# Patient Record
Sex: Male | Born: 1965 | Race: White | Hispanic: No | Marital: Single | Smoking: Former smoker
Health system: Southern US, Community
[De-identification: ages and names within clinical notes are randomized; demographics above are authoritative.]

## PROBLEM LIST (undated history)

## (undated) DIAGNOSIS — C8190 Hodgkin lymphoma, unspecified, unspecified site: Secondary | ICD-10-CM

## (undated) DIAGNOSIS — I2510 Atherosclerotic heart disease of native coronary artery without angina pectoris: Secondary | ICD-10-CM

## (undated) DIAGNOSIS — I4510 Unspecified right bundle-branch block: Secondary | ICD-10-CM

## (undated) DIAGNOSIS — Z720 Tobacco use: Secondary | ICD-10-CM

## (undated) DIAGNOSIS — J449 Chronic obstructive pulmonary disease, unspecified: Secondary | ICD-10-CM

## (undated) HISTORY — DX: Atherosclerotic heart disease of native coronary artery without angina pectoris: I25.10

## (undated) HISTORY — PX: LYMPH NODE BIOPSY: SHX201

---

## 2016-10-26 DIAGNOSIS — H9190 Unspecified hearing loss, unspecified ear: Secondary | ICD-10-CM | POA: Diagnosis not present

## 2016-10-26 DIAGNOSIS — H269 Unspecified cataract: Secondary | ICD-10-CM | POA: Diagnosis not present

## 2016-10-26 DIAGNOSIS — Z23 Encounter for immunization: Secondary | ICD-10-CM | POA: Diagnosis not present

## 2016-10-26 DIAGNOSIS — Z0000 Encounter for general adult medical examination without abnormal findings: Secondary | ICD-10-CM | POA: Diagnosis not present

## 2016-10-26 DIAGNOSIS — M549 Dorsalgia, unspecified: Secondary | ICD-10-CM | POA: Diagnosis not present

## 2016-10-26 DIAGNOSIS — Z8571 Personal history of Hodgkin lymphoma: Secondary | ICD-10-CM | POA: Diagnosis not present

## 2016-10-26 DIAGNOSIS — Z789 Other specified health status: Secondary | ICD-10-CM | POA: Diagnosis not present

## 2016-10-26 DIAGNOSIS — F17200 Nicotine dependence, unspecified, uncomplicated: Secondary | ICD-10-CM | POA: Diagnosis not present

## 2016-10-26 DIAGNOSIS — Z125 Encounter for screening for malignant neoplasm of prostate: Secondary | ICD-10-CM | POA: Diagnosis not present

## 2016-11-07 DIAGNOSIS — H918X2 Other specified hearing loss, left ear: Secondary | ICD-10-CM | POA: Diagnosis not present

## 2016-11-07 DIAGNOSIS — H903 Sensorineural hearing loss, bilateral: Secondary | ICD-10-CM | POA: Diagnosis not present

## 2016-11-09 DIAGNOSIS — F17210 Nicotine dependence, cigarettes, uncomplicated: Secondary | ICD-10-CM | POA: Diagnosis not present

## 2016-11-09 DIAGNOSIS — Z8571 Personal history of Hodgkin lymphoma: Secondary | ICD-10-CM | POA: Diagnosis not present

## 2016-11-09 DIAGNOSIS — M549 Dorsalgia, unspecified: Secondary | ICD-10-CM | POA: Diagnosis not present

## 2016-11-09 DIAGNOSIS — Z716 Tobacco abuse counseling: Secondary | ICD-10-CM | POA: Diagnosis not present

## 2017-01-05 DIAGNOSIS — H2511 Age-related nuclear cataract, right eye: Secondary | ICD-10-CM | POA: Diagnosis not present

## 2017-01-05 DIAGNOSIS — H2589 Other age-related cataract: Secondary | ICD-10-CM | POA: Diagnosis not present

## 2017-03-13 DIAGNOSIS — H2589 Other age-related cataract: Secondary | ICD-10-CM | POA: Diagnosis not present

## 2017-03-19 DIAGNOSIS — H25812 Combined forms of age-related cataract, left eye: Secondary | ICD-10-CM | POA: Diagnosis not present

## 2017-03-19 DIAGNOSIS — H2589 Other age-related cataract: Secondary | ICD-10-CM | POA: Diagnosis not present

## 2017-05-01 DIAGNOSIS — H2511 Age-related nuclear cataract, right eye: Secondary | ICD-10-CM | POA: Diagnosis not present

## 2017-05-01 DIAGNOSIS — H25811 Combined forms of age-related cataract, right eye: Secondary | ICD-10-CM | POA: Diagnosis not present

## 2017-05-01 DIAGNOSIS — Z79899 Other long term (current) drug therapy: Secondary | ICD-10-CM | POA: Diagnosis not present

## 2017-05-01 DIAGNOSIS — G43909 Migraine, unspecified, not intractable, without status migrainosus: Secondary | ICD-10-CM | POA: Diagnosis not present

## 2017-05-01 DIAGNOSIS — J449 Chronic obstructive pulmonary disease, unspecified: Secondary | ICD-10-CM | POA: Diagnosis not present

## 2017-05-01 DIAGNOSIS — F17210 Nicotine dependence, cigarettes, uncomplicated: Secondary | ICD-10-CM | POA: Diagnosis not present

## 2017-08-06 DIAGNOSIS — Z8571 Personal history of Hodgkin lymphoma: Secondary | ICD-10-CM | POA: Diagnosis not present

## 2017-08-06 DIAGNOSIS — R0602 Shortness of breath: Secondary | ICD-10-CM | POA: Diagnosis not present

## 2017-08-06 DIAGNOSIS — R05 Cough: Secondary | ICD-10-CM | POA: Diagnosis not present

## 2017-08-06 DIAGNOSIS — F17210 Nicotine dependence, cigarettes, uncomplicated: Secondary | ICD-10-CM | POA: Diagnosis not present

## 2017-08-06 DIAGNOSIS — J209 Acute bronchitis, unspecified: Secondary | ICD-10-CM | POA: Diagnosis not present

## 2017-12-09 DIAGNOSIS — J029 Acute pharyngitis, unspecified: Secondary | ICD-10-CM | POA: Diagnosis not present

## 2017-12-11 DIAGNOSIS — Z681 Body mass index (BMI) 19 or less, adult: Secondary | ICD-10-CM | POA: Diagnosis not present

## 2017-12-11 DIAGNOSIS — J029 Acute pharyngitis, unspecified: Secondary | ICD-10-CM | POA: Diagnosis not present

## 2018-05-21 DIAGNOSIS — F17210 Nicotine dependence, cigarettes, uncomplicated: Secondary | ICD-10-CM | POA: Diagnosis not present

## 2018-05-21 DIAGNOSIS — J449 Chronic obstructive pulmonary disease, unspecified: Secondary | ICD-10-CM | POA: Diagnosis not present

## 2018-05-21 DIAGNOSIS — R42 Dizziness and giddiness: Secondary | ICD-10-CM | POA: Diagnosis not present

## 2018-06-04 DIAGNOSIS — Z1331 Encounter for screening for depression: Secondary | ICD-10-CM | POA: Diagnosis not present

## 2018-06-04 DIAGNOSIS — R55 Syncope and collapse: Secondary | ICD-10-CM | POA: Diagnosis not present

## 2018-06-04 DIAGNOSIS — F17210 Nicotine dependence, cigarettes, uncomplicated: Secondary | ICD-10-CM | POA: Diagnosis not present

## 2018-06-04 DIAGNOSIS — F17200 Nicotine dependence, unspecified, uncomplicated: Secondary | ICD-10-CM | POA: Diagnosis not present

## 2018-06-04 DIAGNOSIS — R636 Underweight: Secondary | ICD-10-CM | POA: Diagnosis not present

## 2018-06-04 DIAGNOSIS — Z681 Body mass index (BMI) 19 or less, adult: Secondary | ICD-10-CM | POA: Diagnosis not present

## 2018-06-04 DIAGNOSIS — F329 Major depressive disorder, single episode, unspecified: Secondary | ICD-10-CM | POA: Diagnosis not present

## 2018-06-07 DIAGNOSIS — S299XXA Unspecified injury of thorax, initial encounter: Secondary | ICD-10-CM | POA: Diagnosis not present

## 2018-06-07 DIAGNOSIS — S161XXA Strain of muscle, fascia and tendon at neck level, initial encounter: Secondary | ICD-10-CM | POA: Diagnosis not present

## 2018-06-07 DIAGNOSIS — M546 Pain in thoracic spine: Secondary | ICD-10-CM | POA: Diagnosis not present

## 2018-06-07 DIAGNOSIS — M542 Cervicalgia: Secondary | ICD-10-CM | POA: Diagnosis not present

## 2019-09-10 DIAGNOSIS — F17200 Nicotine dependence, unspecified, uncomplicated: Secondary | ICD-10-CM | POA: Diagnosis not present

## 2019-09-10 DIAGNOSIS — I4510 Unspecified right bundle-branch block: Secondary | ICD-10-CM | POA: Diagnosis not present

## 2019-09-10 DIAGNOSIS — R079 Chest pain, unspecified: Secondary | ICD-10-CM | POA: Diagnosis not present

## 2019-09-10 DIAGNOSIS — J449 Chronic obstructive pulmonary disease, unspecified: Secondary | ICD-10-CM | POA: Diagnosis not present

## 2019-09-10 DIAGNOSIS — R072 Precordial pain: Secondary | ICD-10-CM | POA: Diagnosis not present

## 2019-09-10 DIAGNOSIS — Z720 Tobacco use: Secondary | ICD-10-CM | POA: Diagnosis not present

## 2019-09-11 ENCOUNTER — Inpatient Hospital Stay (HOSPITAL_COMMUNITY)
Admission: AD | Admit: 2019-09-11 | Discharge: 2019-09-21 | DRG: 234 | Disposition: A | Discharge status: Home / Self Care | Payer: Medicare HMO | Source: Other Acute Inpatient Hospital | Attending: Cardiothoracic Surgery | Admitting: Cardiothoracic Surgery

## 2019-09-11 ENCOUNTER — Encounter (HOSPITAL_COMMUNITY): Payer: Self-pay | Admitting: Physician Assistant

## 2019-09-11 DIAGNOSIS — Z23 Encounter for immunization: Secondary | ICD-10-CM

## 2019-09-11 DIAGNOSIS — R911 Solitary pulmonary nodule: Secondary | ICD-10-CM | POA: Diagnosis present

## 2019-09-11 DIAGNOSIS — Z720 Tobacco use: Secondary | ICD-10-CM | POA: Diagnosis not present

## 2019-09-11 DIAGNOSIS — I088 Other rheumatic multiple valve diseases: Secondary | ICD-10-CM | POA: Diagnosis not present

## 2019-09-11 DIAGNOSIS — I259 Chronic ischemic heart disease, unspecified: Secondary | ICD-10-CM | POA: Diagnosis not present

## 2019-09-11 DIAGNOSIS — Z9221 Personal history of antineoplastic chemotherapy: Secondary | ICD-10-CM

## 2019-09-11 DIAGNOSIS — R9439 Abnormal result of other cardiovascular function study: Secondary | ICD-10-CM | POA: Diagnosis not present

## 2019-09-11 DIAGNOSIS — E782 Mixed hyperlipidemia: Secondary | ICD-10-CM | POA: Diagnosis present

## 2019-09-11 DIAGNOSIS — I2510 Atherosclerotic heart disease of native coronary artery without angina pectoris: Secondary | ICD-10-CM

## 2019-09-11 DIAGNOSIS — F17210 Nicotine dependence, cigarettes, uncomplicated: Secondary | ICD-10-CM | POA: Diagnosis not present

## 2019-09-11 DIAGNOSIS — D62 Acute posthemorrhagic anemia: Secondary | ICD-10-CM | POA: Diagnosis not present

## 2019-09-11 DIAGNOSIS — M549 Dorsalgia, unspecified: Secondary | ICD-10-CM | POA: Diagnosis present

## 2019-09-11 DIAGNOSIS — F1290 Cannabis use, unspecified, uncomplicated: Secondary | ICD-10-CM | POA: Diagnosis not present

## 2019-09-11 DIAGNOSIS — Z9889 Other specified postprocedural states: Secondary | ICD-10-CM

## 2019-09-11 DIAGNOSIS — Z01810 Encounter for preprocedural cardiovascular examination: Secondary | ICD-10-CM | POA: Diagnosis not present

## 2019-09-11 DIAGNOSIS — J439 Emphysema, unspecified: Secondary | ICD-10-CM | POA: Diagnosis not present

## 2019-09-11 DIAGNOSIS — Z20828 Contact with and (suspected) exposure to other viral communicable diseases: Secondary | ICD-10-CM | POA: Diagnosis present

## 2019-09-11 DIAGNOSIS — Z8572 Personal history of non-Hodgkin lymphomas: Secondary | ICD-10-CM

## 2019-09-11 DIAGNOSIS — D72829 Elevated white blood cell count, unspecified: Secondary | ICD-10-CM | POA: Diagnosis not present

## 2019-09-11 DIAGNOSIS — G8929 Other chronic pain: Secondary | ICD-10-CM | POA: Diagnosis present

## 2019-09-11 DIAGNOSIS — J441 Chronic obstructive pulmonary disease with (acute) exacerbation: Secondary | ICD-10-CM | POA: Diagnosis not present

## 2019-09-11 DIAGNOSIS — R001 Bradycardia, unspecified: Secondary | ICD-10-CM | POA: Diagnosis not present

## 2019-09-11 DIAGNOSIS — Z885 Allergy status to narcotic agent status: Secondary | ICD-10-CM

## 2019-09-11 DIAGNOSIS — T50995A Adverse effect of other drugs, medicaments and biological substances, initial encounter: Secondary | ICD-10-CM | POA: Diagnosis not present

## 2019-09-11 DIAGNOSIS — J449 Chronic obstructive pulmonary disease, unspecified: Secondary | ICD-10-CM

## 2019-09-11 DIAGNOSIS — D696 Thrombocytopenia, unspecified: Secondary | ICD-10-CM | POA: Diagnosis not present

## 2019-09-11 DIAGNOSIS — I25110 Atherosclerotic heart disease of native coronary artery with unstable angina pectoris: Secondary | ICD-10-CM | POA: Diagnosis present

## 2019-09-11 DIAGNOSIS — Z716 Tobacco abuse counseling: Secondary | ICD-10-CM | POA: Diagnosis not present

## 2019-09-11 DIAGNOSIS — I25118 Atherosclerotic heart disease of native coronary artery with other forms of angina pectoris: Secondary | ICD-10-CM | POA: Diagnosis not present

## 2019-09-11 DIAGNOSIS — Z4682 Encounter for fitting and adjustment of non-vascular catheter: Secondary | ICD-10-CM | POA: Diagnosis not present

## 2019-09-11 DIAGNOSIS — Z951 Presence of aortocoronary bypass graft: Secondary | ICD-10-CM

## 2019-09-11 DIAGNOSIS — Z8249 Family history of ischemic heart disease and other diseases of the circulatory system: Secondary | ICD-10-CM | POA: Diagnosis not present

## 2019-09-11 DIAGNOSIS — E43 Unspecified severe protein-calorie malnutrition: Secondary | ICD-10-CM | POA: Insufficient documentation

## 2019-09-11 DIAGNOSIS — I444 Left anterior fascicular block: Secondary | ICD-10-CM | POA: Diagnosis not present

## 2019-09-11 DIAGNOSIS — I214 Non-ST elevation (NSTEMI) myocardial infarction: Secondary | ICD-10-CM | POA: Diagnosis not present

## 2019-09-11 DIAGNOSIS — I252 Old myocardial infarction: Secondary | ICD-10-CM | POA: Diagnosis not present

## 2019-09-11 DIAGNOSIS — I4510 Unspecified right bundle-branch block: Secondary | ICD-10-CM | POA: Diagnosis present

## 2019-09-11 DIAGNOSIS — I10 Essential (primary) hypertension: Secondary | ICD-10-CM | POA: Diagnosis not present

## 2019-09-11 DIAGNOSIS — J939 Pneumothorax, unspecified: Secondary | ICD-10-CM | POA: Diagnosis not present

## 2019-09-11 DIAGNOSIS — J9811 Atelectasis: Secondary | ICD-10-CM | POA: Diagnosis not present

## 2019-09-11 DIAGNOSIS — J90 Pleural effusion, not elsewhere classified: Secondary | ICD-10-CM | POA: Diagnosis not present

## 2019-09-11 DIAGNOSIS — R072 Precordial pain: Secondary | ICD-10-CM | POA: Diagnosis not present

## 2019-09-11 DIAGNOSIS — I471 Supraventricular tachycardia: Secondary | ICD-10-CM | POA: Diagnosis not present

## 2019-09-11 DIAGNOSIS — F17200 Nicotine dependence, unspecified, uncomplicated: Secondary | ICD-10-CM | POA: Diagnosis not present

## 2019-09-11 DIAGNOSIS — I25119 Atherosclerotic heart disease of native coronary artery with unspecified angina pectoris: Secondary | ICD-10-CM | POA: Diagnosis not present

## 2019-09-11 DIAGNOSIS — Z09 Encounter for follow-up examination after completed treatment for conditions other than malignant neoplasm: Secondary | ICD-10-CM

## 2019-09-11 DIAGNOSIS — R079 Chest pain, unspecified: Secondary | ICD-10-CM

## 2019-09-11 HISTORY — DX: Chronic obstructive pulmonary disease, unspecified: J44.9

## 2019-09-11 HISTORY — DX: Hodgkin lymphoma, unspecified, unspecified site: C81.90

## 2019-09-11 HISTORY — DX: Tobacco use: Z72.0

## 2019-09-11 HISTORY — DX: Unspecified right bundle-branch block: I45.10

## 2019-09-11 HISTORY — DX: Chest pain, unspecified: R07.9

## 2019-09-11 LAB — TROPONIN I (HIGH SENSITIVITY): Troponin I (High Sensitivity): 5 ng/L (ref <18)

## 2019-09-11 MED ORDER — SODIUM CHLORIDE 0.9 % IV SOLN: 250.0000 mL | INTRAVENOUS | Status: DC | PRN

## 2019-09-11 MED ORDER — SODIUM CHLORIDE 0.9 % WEIGHT BASED INFUSION
1.0000 mL/kg/h | INTRAVENOUS | Status: DC
  Administered 2019-09-12: 05:00:00 1 mL/kg/h via INTRAVENOUS

## 2019-09-11 MED ORDER — ASPIRIN 81 MG PO CHEW
81.0000 mg | CHEWABLE_TABLET | ORAL | Status: AC
  Administered 2019-09-12: 07:00:00 81 mg via ORAL
  Filled 2019-09-11: qty 1

## 2019-09-11 MED ORDER — ACETAMINOPHEN 325 MG PO TABS
650.0000 mg | ORAL_TABLET | ORAL | Status: DC | PRN
  Administered 2019-09-13: 650 mg via ORAL
  Filled 2019-09-11: qty 2

## 2019-09-11 MED ORDER — HEPARIN (PORCINE) 25000 UT/250ML-% IV SOLN
900.0000 [IU]/h | INTRAVENOUS | Status: DC
  Administered 2019-09-11: 750 [IU]/h via INTRAVENOUS
  Filled 2019-09-11: qty 250

## 2019-09-11 MED ORDER — HEPARIN BOLUS VIA INFUSION
3500.0000 [IU] | Freq: Once | INTRAVENOUS | Status: AC
  Administered 2019-09-11: 3500 [IU] via INTRAVENOUS
  Filled 2019-09-11: qty 3500

## 2019-09-11 MED ORDER — SODIUM CHLORIDE 0.9% FLUSH: 3.0000 mL | INTRAVENOUS | Status: DC | PRN

## 2019-09-11 MED ORDER — SODIUM CHLORIDE 0.9 % WEIGHT BASED INFUSION
3.0000 mL/kg/h | INTRAVENOUS | Status: DC
  Administered 2019-09-12: 05:00:00 3 mL/kg/h via INTRAVENOUS

## 2019-09-11 MED ORDER — ONDANSETRON HCL 4 MG/2ML IJ SOLN: 4.0000 mg | Freq: Four times a day (QID) | INTRAMUSCULAR | Status: DC | PRN

## 2019-09-11 MED ORDER — SODIUM CHLORIDE 0.9% FLUSH
3.0000 mL | Freq: Two times a day (BID) | INTRAVENOUS | Status: DC
  Administered 2019-09-11 – 2019-09-14 (×3): 3 mL via INTRAVENOUS

## 2019-09-11 NOTE — H&P (Signed)
History and Physical   Russell Harris [id number] DOB: 10-11-1966 DOA: 09/11/2019  Referring MD/NP/PA: From [facility]  PCP: Street, Sharon Mt, MD   Outpatient Specialists: None  Patient coming from: [facility]  Chief Complaint: Chest pain  HPI: Russell Harris is a 53 y.o. male with medical history significant of hypertension, Hodgkin's lymphoma, COPD, tobacco abuse, right bundle branch block and extensive family history of coronary artery disease who came to the ER at [facility] with substernal chest pain yesterday that has been on and up and persistent.  Rated as 6 out of 10.  He is chest pain-free now after nitroglycerin.  Patient smokes about a pack per day.  2 of his siblings had coronary artery disease 1 of whom passed away early.  He did have troponin negative x3.  Nuclear medicine Myoview was done today which shows normal EF but abnormal scan with large anterior wall ischemia.  Patient was transferred here for further cardiology evaluation.  He denied any chest pain or shortness of breath now no fever no cough no nausea vomiting or diarrhea..    Review of Systems: As per HPI otherwise 10 point review of systems negative.    Past Medical History:  Diagnosis Date  . COPD (chronic obstructive pulmonary disease) ([place])   . Hodgkin lymphoma ([place])    1997, s/p chemo  . RBBB   . Tobacco abuse     Past Surgical History:  Procedure Laterality Date  . LYMPH NODE BIOPSY     of neck, diagnosed with hodgkin's lymphoma in 1997, s/p 6 month of chemo     reports that he has been smoking cigarettes. He has never used smokeless tobacco. He reports current alcohol use. He reports current drug use. Drug: Marijuana.  Not on File  Family History  Problem Relation Age of Onset  . Aneurysm Father        of brain  . Heart attack Sister        before age 68  . Heart attack Brother        2 brothers had MI after age 53s     Prior to Admission medications   Not on File     Physical Exam: Vitals:   09/11/19 1900 09/11/19 2000 09/11/19 2115  BP: 114/78  119/71  Pulse: 61  60  Resp: 16  20  Temp: 98.7 F (37.1 C)  98.4 F (36.9 C)  TempSrc: Oral  Oral  SpO2: 98%  99%  Weight:  61 kg       Constitutional: NAD, calm, comfortable Vitals:   09/11/19 1900 09/11/19 2000 09/11/19 2115  BP: 114/78  119/71  Pulse: 61  60  Resp: 16  20  Temp: 98.7 F (37.1 C)  98.4 F (36.9 C)  TempSrc: Oral  Oral  SpO2: 98%  99%  Weight:  61 kg    Eyes: PERRL, lids and conjunctivae normal ENMT: Mucous membranes are moist. Posterior pharynx clear of any exudate or lesions.Normal dentition.  Neck: normal, supple, no masses, no thyromegaly Respiratory: clear to auscultation bilaterally, no wheezing, no crackles. Normal respiratory effort. No accessory muscle use.  Cardiovascular: Regular rate and rhythm, no murmurs / rubs / gallops. No extremity edema. 2+ pedal pulses. No carotid bruits.  Abdomen: no tenderness, no masses palpated. No hepatosplenomegaly. Bowel sounds positive.  Musculoskeletal: no clubbing / cyanosis. No joint deformity upper and lower extremities. Good ROM, no contractures. Normal muscle tone.  Skin: no rashes, lesions, ulcers. No induration Neurologic:  CN 2-12 grossly intact. Sensation intact, DTR normal. Strength 5/5 in all 4.  Psychiatric: Normal judgment and insight. Alert and oriented x 3. Normal mood.     Labs on Admission: I have personally reviewed following labs and imaging studies  CBC: No results for input(s): WBC, NEUTROABS, HGB, HCT, MCV, PLT in the last 168 hours. Basic Metabolic Panel: No results for input(s): NA, K, CL, CO2, GLUCOSE, BUN, CREATININE, CALCIUM, MG, PHOS in the last 168 hours. GFR: CrCl cannot be calculated (No successful lab value found.). Liver Function Tests: No results for input(s): AST, ALT, ALKPHOS, BILITOT, PROT, ALBUMIN in the last 168 hours. No results for input(s): LIPASE, AMYLASE in the last 168 hours. No  results for input(s): AMMONIA in the last 168 hours. Coagulation Profile: No results for input(s): INR, PROTIME in the last 168 hours. Cardiac Enzymes: No results for input(s): CKTOTAL, CKMB, CKMBINDEX, TROPONINI in the last 168 hours. BNP (last 3 results) No results for input(s): PROBNP in the last 8760 hours. HbA1C: No results for input(s): HGBA1C in the last 72 hours. CBG: No results for input(s): GLUCAP in the last 168 hours. Lipid Profile: No results for input(s): CHOL, HDL, LDLCALC, TRIG, CHOLHDL, LDLDIRECT in the last 72 hours. Thyroid Function Tests: No results for input(s): TSH, T4TOTAL, FREET4, T3FREE, THYROIDAB in the last 72 hours. Anemia Panel: No results for input(s): VITAMINB12, FOLATE, FERRITIN, TIBC, IRON, RETICCTPCT in the last 72 hours. Urine analysis: No results found for: COLORURINE, APPEARANCEUR, LABSPEC, PHURINE, GLUCOSEU, HGBUR, BILIRUBINUR, KETONESUR, PROTEINUR, UROBILINOGEN, NITRITE, LEUKOCYTESUR Sepsis Labs: @LABRCNTIP (procalcitonin:4,lacticidven:4) )No results found for this or any previous visit (from the past 240 hour(s)).   Radiological Exams on Admission: No results found.  EKG: Independently reviewed.  Pending current  Assessment/Plan Principal Problem:   Chest pain Active Problems:   Abnormal stress test   Tobacco abuse   Benign essential HTN   COPD (chronic obstructive pulmonary disease) ([place])     #1 chest pain: Now resolved.  Abnormal cardiac stress testing.  Initiate IV heparin.  PRN nitroglycerin.  Cardiology consulted.  Will follow recommendations.  #2 tobacco abuse: Counseling provided.  Initiate nicotine patch.  #3 elevated blood pressure: No previous diagnosis of hypertension.  Monitor blood pressure closely and treat as necessary  #4 COPD: No exacerbation.  Continue empiric treatment   DVT prophylaxis: Heparin drip Code Status: Full code Family Communication: No family at bedside Disposition Plan: To be determined Consults  called: Cardiology Dr. Lake Bells O'Neal Admission status: Inpatient  Severity of Illness: The appropriate patient status for this patient is INPATIENT. Inpatient status is judged to be reasonable and necessary in order to provide the required intensity of service to ensure the patient's safety. The patient's presenting symptoms, physical exam findings, and initial radiographic and laboratory data in the context of their chronic comorbidities is felt to place them at high risk for further clinical deterioration. Furthermore, it is not anticipated that the patient will be medically stable for discharge from the hospital within 2 midnights of admission. The following factors support the patient status of inpatient.   " The patient's presenting symptoms include chest pain. " The worrisome physical exam findings include no significant finding on exam. " The initial radiographic and laboratory data are worrisome because of abnormal cardiac stress test. " The chronic co-morbidities include tobacco abuse and COPD.   * I certify that at the point of admission it is my clinical judgment that the patient will require inpatient hospital care spanning beyond 2 midnights from  the point of admission due to high intensity of service, high risk for further deterioration and high frequency of surveillance required.Barbette Merino MD [facility] Pager [phone]  If 7PM-7AM, please contact night-coverage [email or web address] Password [facility]  09/12/2019, 12:28 AM

## 2019-09-11 NOTE — Progress Notes (Signed)
ANTICOAGULATION CONSULT NOTE - Initial Consult  Pharmacy Consult for heparin Indication: chest pain/ACS  Not on File  Patient Measurements:   Heparin Dosing Weight: 61.5kg  Vital Signs: Temp: 98.7 F (37.1 C) (09/17 1900) Temp Source: Oral (09/17 1900) BP: 114/78 (09/17 1900) Pulse Rate: 61 (09/17 1900)  Labs: No results for input(s): HGB, HCT, PLT, APTT, LABPROT, INR, HEPARINUNFRC, HEPRLOWMOCWT, CREATININE, CKTOTAL, CKMB, TROPONINIHS in the last 72 hours.  CrCl cannot be calculated (No successful lab value found.).   Medical History: No past medical history on file.    Assessment: 80 yoM transferred from OSH with CP and concern for ACS after high risk stress test. Wt this morning was 61.5 kg, no IV heparin started prior to transfer. No AC noted PTA.  Goal of Therapy:  Heparin level 0.3-0.7 units/ml Monitor platelets by anticoagulation protocol: Yes   Plan:  -Heparin 3500 units x1 -Heparin 750 units/hr -Check 6-hr heparin level -Monitor heparin level, CBC, S/Sx bleeding daily   Arrie Senate, PharmD, BCPS Clinical Pharmacist [phone] Please check AMION for all [place] numbers 09/11/2019

## 2019-09-11 NOTE — Consult Note (Signed)
Cardiology Consultation:   Patient ID: Russell MANELLA MRN: [id number]; DOB: Jan 18, 1966  Admit date: 09/11/2019 Date of Consult: 09/11/2019  Primary Care Provider: Street, Sharon Mt, MD Primary Cardiologist: Vance Gather Primary Electrophysiologist:  None    Patient Profile:   Russell Harris is a 53 y.o. male with a hx of  tobacco abuse, Hodgkin's lymphoma diagnosed in 1997 s/p 6 months of chemotherapy, RBBB and COPD who is being seen today for the evaluation of abnormal stress test at the request of Dr. Jonelle Sidle.  History of Present Illness:   Russell Harris is a 53 year old male with PMH of tobacco abuse, Hodgkin's lymphoma diagnosed in 1997 s/p 6 months of chemotherapy, RBBB and COPD.  He usually walks 2 miles a day with his dog and has not noticed any recent exertional symptoms.  He started having intermittent chest pain on Tuesday night after dinner.  [facility] eventually [facility].  Troponin negative x3.  Blood pressure on arrival was normal.  He eventually proceeded with Myoview on 09/11/2019 which showed normal EF, large anterior wall ischemia.  He was subsequently transferred to [facility] for further evaluation.  Heart Pathway Score:     Past Medical History:  Diagnosis Date  . COPD (chronic obstructive pulmonary disease) ([place])   . Hodgkin lymphoma ([place])    1997, s/p chemo  . RBBB   . Tobacco abuse     Past Surgical History:  Procedure Laterality Date  . LYMPH NODE BIOPSY     of neck, diagnosed with hodgkin's lymphoma in 1997, s/p 6 month of chemo     Home Medications:  Prior to Admission medications   Not on File    Inpatient Medications: Scheduled Meds:  Continuous Infusions:  PRN Meds:   Allergies:   Not on File  Social History:   Social History   Socioeconomic History  . Marital status: Single    Spouse name: Not on file  . Number of children: Not on file  . Years of education: Not on file  . Highest education  level: Not on file  Occupational History  . Not on file  Social Needs  . Financial resource strain: Not on file  . Food insecurity    Worry: Not on file    Inability: Not on file  . Transportation needs    Medical: Not on file    Non-medical: Not on file  Tobacco Use  . Smoking status: Current Every Day Smoker    Types: Cigarettes  . Smokeless tobacco: Never Used  Substance and Sexual Activity  . Alcohol use: Yes    Comment: rarely  . Drug use: Yes    Types: Marijuana  . Sexual activity: Not on file  Lifestyle  . Physical activity    Days per week: Not on file    Minutes per session: Not on file  . Stress: Not on file  Relationships  . Social Herbalist on phone: Not on file    Gets together: Not on file    Attends religious service: Not on file    Active member of club or organization: Not on file    Attends meetings of clubs or organizations: Not on file    Relationship status: Not on file  . Intimate partner violence    Fear of current or ex partner: Not on file    Emotionally abused: Not on file    Physically abused: Not on file  Forced sexual activity: Not on file  Other Topics Concern  . Not on file  Social History Narrative  . Not on file    Family History:    Family History  Problem Relation Age of Onset  . Aneurysm Father        of brain  . Heart attack Sister        before age 39  . Heart attack Brother        2 brothers had MI after age 14s     ROS:  Please see the history of present illness.   All other ROS reviewed and negative.     Physical Exam/Data:   Vitals:   09/11/19 1900  BP: 114/78  Pulse: 61  Resp: 16  Temp: 98.7 F (37.1 C)  TempSrc: Oral  SpO2: 98%   No intake or output data in the 24 hours ending 09/11/19 2004 No flowsheet data found.   There is no height or weight on file to calculate BMI.  General:  Well nourished, well developed, in no acute distress HEENT: normal Lymph: no adenopathy Neck: no JVD  Endocrine:  No thryomegaly Vascular: No carotid bruits; FA pulses 2+ bilaterally without bruits  Cardiac:  normal S1, S2; RRR; no murmur  Lungs:  clear to auscultation bilaterally, no wheezing, rhonchi or rales  Abd: soft, nontender, no hepatomegaly  Ext: no edema Musculoskeletal:  No deformities, BUE and BLE strength normal and equal Skin: warm and dry  Neuro:  CNs 2-12 intact, no focal abnormalities noted Psych:  Normal affect   EKG:  The EKG was personally reviewed and demonstrates: Outside EKG reviewed, sinus rhythm with right bundle branch block Telemetry:  Telemetry was personally reviewed and demonstrates: Sinus rhythm without significant ventricular ectopy.  Relevant CV Studies:  Outside Myoview obtained on 09/10/2018 Normal EF, large anterior ischemia  Laboratory Data:  High Sensitivity Troponin:  No results for input(s): TROPONINIHS in the last 720 hours.   ChemistryNo results for input(s): NA, K, CL, CO2, GLUCOSE, BUN, CREATININE, CALCIUM, GFRNONAA, GFRAA, ANIONGAP in the last 168 hours.  No results for input(s): PROT, ALBUMIN, AST, ALT, ALKPHOS, BILITOT in the last 168 hours. HematologyNo results for input(s): WBC, RBC, HGB, HCT, MCV, MCH, MCHC, RDW, PLT in the last 168 hours. BNPNo results for input(s): BNP, PROBNP in the last 168 hours.  DDimer No results for input(s): DDIMER in the last 168 hours.   Radiology/Studies:  No results found.  Assessment and Plan:   1. Chest pain: Large anterior ischemia noted on Myoview at [facility].  EF was normal.  Plan to proceed with cardiac catheterization tomorrow, risk and benefit has been discussed with the patient.  -Cardiac risk factors include age, tobacco abuse, and a family history of early CAD.  - Risk and benefit of procedure explained to the patient who display clear understanding and agree to proceed. Discussed with patient possible procedural risk include bleeding, vascular injury, renal injury, arrythmia, MI,  stroke and loss of limb or life.  -Obtain fasting lipid panel.  2. Tobacco abuse: Tobacco cessation strongly advised.  3. COPD: Related to history of tobacco abuse.  4. Hodgkin's lymphoma: He is currently not being followed by a oncologist.  Confirmed by biopsy according to the patient in 1997, finished 95-month course of chemotherapy.      For questions or updates, please contact [place] Please consult [email or web address] for contact info under     Hilbert Corrigan, [place]  09/11/2019 8:04 PM

## 2019-09-12 ENCOUNTER — Other Ambulatory Visit: Payer: Self-pay | Admitting: *Deleted

## 2019-09-12 ENCOUNTER — Encounter (HOSPITAL_COMMUNITY): Payer: Self-pay | Admitting: Cardiovascular Disease

## 2019-09-12 ENCOUNTER — Inpatient Hospital Stay (HOSPITAL_COMMUNITY): Payer: Medicare HMO

## 2019-09-12 ENCOUNTER — Encounter (HOSPITAL_COMMUNITY)
Admission: AD | Disposition: A | Discharge status: Home / Self Care | Payer: Self-pay | Source: Other Acute Inpatient Hospital | Attending: Cardiothoracic Surgery

## 2019-09-12 DIAGNOSIS — I10 Essential (primary) hypertension: Secondary | ICD-10-CM

## 2019-09-12 DIAGNOSIS — J441 Chronic obstructive pulmonary disease with (acute) exacerbation: Secondary | ICD-10-CM

## 2019-09-12 DIAGNOSIS — R072 Precordial pain: Secondary | ICD-10-CM

## 2019-09-12 DIAGNOSIS — Z720 Tobacco use: Secondary | ICD-10-CM

## 2019-09-12 DIAGNOSIS — I2510 Atherosclerotic heart disease of native coronary artery without angina pectoris: Secondary | ICD-10-CM

## 2019-09-12 DIAGNOSIS — R9439 Abnormal result of other cardiovascular function study: Secondary | ICD-10-CM

## 2019-09-12 DIAGNOSIS — I25110 Atherosclerotic heart disease of native coronary artery with unstable angina pectoris: Secondary | ICD-10-CM

## 2019-09-12 DIAGNOSIS — J449 Chronic obstructive pulmonary disease, unspecified: Secondary | ICD-10-CM | POA: Diagnosis present

## 2019-09-12 HISTORY — DX: Abnormal result of other cardiovascular function study: R94.39

## 2019-09-12 HISTORY — DX: Essential (primary) hypertension: I10

## 2019-09-12 HISTORY — PX: LEFT HEART CATH AND CORONARY ANGIOGRAPHY: CATH118249

## 2019-09-12 LAB — PULMONARY FUNCTION TEST
FEF 25-75 Pre: 4.29 L/sec
FEF2575-%Pred-Pre: 125 %
FEV1-%Pred-Pre: 103 %
FEV1-Pre: 4.18 L
FEV1FVC-%Pred-Pre: 103 %
FEV6-%Pred-Pre: 102 %
FEV6-Pre: 5.2 L
FEV6FVC-%Pred-Pre: 102 %
FVC-%Pred-Pre: 100 %
FVC-Pre: 5.28 L
Pre FEV1/FVC ratio: 79 %
Pre FEV6/FVC Ratio: 99 %

## 2019-09-12 LAB — HIV ANTIBODY (ROUTINE TESTING W REFLEX): HIV Screen 4th Generation wRfx: NONREACTIVE

## 2019-09-12 LAB — BASIC METABOLIC PANEL
Anion gap: 12 (ref 5–15)
BUN: 9 mg/dL (ref 6–20)
CO2: 25 mmol/L (ref 22–32)
Calcium: 9.4 mg/dL (ref 8.9–10.3)
Chloride: 103 mmol/L (ref 98–111)
Creatinine, Ser: 0.93 mg/dL (ref 0.61–1.24)
GFR calc Af Amer: >60 mL/min (ref >60)
GFR calc non Af Amer: >60 mL/min (ref >60)
Glucose, Bld: 75 mg/dL (ref 70–99)
Potassium: 3.7 mmol/L (ref 3.5–5.1)
Sodium: 140 mmol/L (ref 135–145)

## 2019-09-12 LAB — CBC
HCT: 40.4 % (ref 39.0–52.0)
Hemoglobin: 13.9 g/dL (ref 13.0–17.0)
MCH: 33.2 pg (ref 26.0–34.0)
MCHC: 34.4 g/dL (ref 30.0–36.0)
MCV: 96.4 fL (ref 80.0–100.0)
Platelets: 162 10*3/uL (ref 150–400)
RBC: 4.19 MIL/uL — ABNORMAL LOW (ref 4.22–5.81)
RDW: 12.9 % (ref 11.5–15.5)
WBC: 9.8 10*3/uL (ref 4.0–10.5)
nRBC: 0 % (ref 0.0–0.2)

## 2019-09-12 LAB — LIPID PANEL
Cholesterol: 191 mg/dL (ref 0–200)
HDL: 51 mg/dL (ref >40)
LDL Cholesterol: 129 mg/dL — ABNORMAL HIGH (ref 0–99)
Total CHOL/HDL Ratio: 3.7 RATIO
Triglycerides: 55 mg/dL (ref <150)
VLDL: 11 mg/dL (ref 0–40)

## 2019-09-12 LAB — HEPARIN LEVEL (UNFRACTIONATED): Heparin Unfractionated: 0.28 IU/mL — ABNORMAL LOW (ref 0.30–0.70)

## 2019-09-12 LAB — SARS CORONAVIRUS 2 BY RT PCR (HOSPITAL ORDER, PERFORMED IN ~~LOC~~ HOSPITAL LAB): SARS Coronavirus 2: NEGATIVE

## 2019-09-12 LAB — TROPONIN I (HIGH SENSITIVITY): Troponin I (High Sensitivity): 5 ng/L (ref <18)

## 2019-09-12 LAB — ECHOCARDIOGRAM COMPLETE
Height: 72 in
Weight: 2137.58 oz

## 2019-09-12 SURGERY — LEFT HEART CATH AND CORONARY ANGIOGRAPHY
Anesthesia: LOCAL

## 2019-09-12 MED ORDER — LIDOCAINE HCL (PF) 1 % IJ SOLN
INTRAMUSCULAR | Status: AC
  Filled 2019-09-12: qty 30

## 2019-09-12 MED ORDER — METOPROLOL TARTRATE 12.5 MG HALF TABLET
12.5000 mg | ORAL_TABLET | Freq: Two times a day (BID) | ORAL | Status: DC
  Administered 2019-09-12 (×2): 12.5 mg via ORAL
  Filled 2019-09-12 (×2): qty 1

## 2019-09-12 MED ORDER — NICOTINE 21 MG/24HR TD PT24
21.0000 mg | MEDICATED_PATCH | Freq: Every day | TRANSDERMAL | Status: DC
  Administered 2019-09-12 – 2019-09-15 (×4): 21 mg via TRANSDERMAL
  Filled 2019-09-12 (×4): qty 1

## 2019-09-12 MED ORDER — VERAPAMIL HCL 2.5 MG/ML IV SOLN
INTRAVENOUS | Status: DC | PRN
  Administered 2019-09-12: 10 mL via INTRA_ARTERIAL

## 2019-09-12 MED ORDER — LABETALOL HCL 5 MG/ML IV SOLN: 10.0000 mg | INTRAVENOUS | Status: AC | PRN

## 2019-09-12 MED ORDER — HEPARIN SODIUM (PORCINE) 1000 UNIT/ML IJ SOLN
INTRAMUSCULAR | Status: AC
  Filled 2019-09-12: qty 1

## 2019-09-12 MED ORDER — MIDAZOLAM HCL 2 MG/2ML IJ SOLN
INTRAMUSCULAR | Status: AC
  Filled 2019-09-12: qty 2

## 2019-09-12 MED ORDER — VERAPAMIL HCL 2.5 MG/ML IV SOLN
INTRAVENOUS | Status: AC
  Filled 2019-09-12: qty 2

## 2019-09-12 MED ORDER — FENTANYL CITRATE (PF) 100 MCG/2ML IJ SOLN
INTRAMUSCULAR | Status: DC | PRN
  Administered 2019-09-12: 25 ug via INTRAVENOUS

## 2019-09-12 MED ORDER — FENTANYL CITRATE (PF) 100 MCG/2ML IJ SOLN
INTRAMUSCULAR | Status: AC
  Filled 2019-09-12: qty 2

## 2019-09-12 MED ORDER — ASPIRIN 81 MG PO CHEW
81.0000 mg | CHEWABLE_TABLET | Freq: Every day | ORAL | Status: DC
  Administered 2019-09-13 – 2019-09-15 (×3): 81 mg via ORAL
  Filled 2019-09-12 (×3): qty 1

## 2019-09-12 MED ORDER — SODIUM CHLORIDE 0.9% FLUSH: 3.0000 mL | INTRAVENOUS | Status: DC | PRN

## 2019-09-12 MED ORDER — SODIUM CHLORIDE 0.9 % IV SOLN
250.0000 mL | INTRAVENOUS | Status: DC | PRN
  Administered 2019-09-12 – 2019-09-13 (×2): 250 mL via INTRAVENOUS

## 2019-09-12 MED ORDER — ISOSORBIDE MONONITRATE ER 30 MG PO TB24
30.0000 mg | ORAL_TABLET | Freq: Every day | ORAL | Status: DC
  Administered 2019-09-12 – 2019-09-15 (×4): 30 mg via ORAL
  Filled 2019-09-12 (×4): qty 1

## 2019-09-12 MED ORDER — ATORVASTATIN CALCIUM 80 MG PO TABS
80.0000 mg | ORAL_TABLET | Freq: Every day | ORAL | Status: DC
  Administered 2019-09-12 – 2019-09-20 (×8): 80 mg via ORAL
  Filled 2019-09-12 (×8): qty 1

## 2019-09-12 MED ORDER — HEPARIN (PORCINE) IN NACL 1000-0.9 UT/500ML-% IV SOLN
INTRAVENOUS | Status: AC
  Filled 2019-09-12: qty 1000

## 2019-09-12 MED ORDER — HYDRALAZINE HCL 20 MG/ML IJ SOLN: 10.0000 mg | INTRAMUSCULAR | Status: AC | PRN

## 2019-09-12 MED ORDER — DIAZEPAM 5 MG PO TABS: 5.0000 mg | ORAL_TABLET | Freq: Four times a day (QID) | ORAL | Status: DC | PRN

## 2019-09-12 MED ORDER — HEPARIN SODIUM (PORCINE) 1000 UNIT/ML IJ SOLN
INTRAMUSCULAR | Status: DC | PRN
  Administered 2019-09-12: 3000 [IU] via INTRAVENOUS

## 2019-09-12 MED ORDER — HEPARIN (PORCINE) IN NACL 1000-0.9 UT/500ML-% IV SOLN
INTRAVENOUS | Status: DC | PRN
  Administered 2019-09-12 (×2): 500 mL

## 2019-09-12 MED ORDER — IOPAMIDOL (ISOVUE-370) INJECTION 76%
INTRAVENOUS | Status: DC | PRN
  Administered 2019-09-12: 12:00:00 75 mL via INTRA_ARTERIAL

## 2019-09-12 MED ORDER — MIDAZOLAM HCL 2 MG/2ML IJ SOLN
INTRAMUSCULAR | Status: DC | PRN
  Administered 2019-09-12: 2 mg via INTRAVENOUS

## 2019-09-12 MED ORDER — LIDOCAINE HCL (PF) 1 % IJ SOLN
INTRAMUSCULAR | Status: DC | PRN
  Administered 2019-09-12: 2 mL

## 2019-09-12 MED ORDER — ONDANSETRON HCL 4 MG/2ML IJ SOLN: 4.0000 mg | Freq: Four times a day (QID) | INTRAMUSCULAR | Status: DC | PRN

## 2019-09-12 MED ORDER — SODIUM CHLORIDE 0.9% FLUSH: 3.0000 mL | Freq: Two times a day (BID) | INTRAVENOUS | Status: DC

## 2019-09-12 MED ORDER — SODIUM CHLORIDE 0.9 % IV SOLN: INTRAVENOUS | Status: DC

## 2019-09-12 MED ORDER — HEPARIN (PORCINE) 25000 UT/250ML-% IV SOLN
950.0000 [IU]/h | INTRAVENOUS | Status: DC
  Administered 2019-09-12: 20:00:00 900 [IU]/h via INTRAVENOUS
  Administered 2019-09-13: 1350 [IU]/h via INTRAVENOUS
  Administered 2019-09-14: 1100 [IU]/h via INTRAVENOUS
  Administered 2019-09-15: 950 [IU]/h via INTRAVENOUS
  Filled 2019-09-12 (×4): qty 250

## 2019-09-12 MED ORDER — ACETAMINOPHEN 325 MG PO TABS: 650.0000 mg | ORAL_TABLET | ORAL | Status: DC | PRN

## 2019-09-12 SURGICAL SUPPLY — 12 items
CATH INFINITI 5FR ANG PIGTAIL (CATHETERS) ×1 IMPLANT
CATH OPTITORQUE TIG 4.0 5F (CATHETERS) ×1 IMPLANT
DEVICE RAD COMP TR BAND LRG (VASCULAR PRODUCTS) ×1 IMPLANT
GLIDESHEATH SLEND SS 6F .021 (SHEATH) ×1 IMPLANT
GUIDEWIRE INQWIRE 1.5J.035X260 (WIRE) IMPLANT
INQWIRE 1.5J .035X260CM (WIRE) ×2
KIT HEART LEFT (KITS) ×2 IMPLANT
PACK CARDIAC CATHETERIZATION (CUSTOM PROCEDURE TRAY) ×2 IMPLANT
SHEATH PROBE COVER 6X72 (BAG) ×1 IMPLANT
SYR MEDRAD MARK 7 150ML (SYRINGE) ×1 IMPLANT
TRANSDUCER W/STOPCOCK (MISCELLANEOUS) ×2 IMPLANT
TUBING CIL FLEX 10 FLL-RA (TUBING) ×2 IMPLANT

## 2019-09-12 NOTE — Progress Notes (Signed)
[place] for heparin Indication: chest pain/ACS  Allergies  Allergen Reactions  . Ketorolac Hives  . Tramadol Hives    Patient Measurements: Height: 6' (182.9 cm) Weight: 133 lb 9.6 oz (60.6 kg) IBW/kg (Calculated) : 77.6 Heparin Dosing Weight: 61.5kg  Vital Signs: Temp: 98 F (36.7 C) (09/18 0535) Temp Source: Oral (09/18 0535) BP: 105/59 (09/18 1159) Pulse Rate: 68 (09/18 1159)  Labs: Recent Labs    09/11/19 2148 09/12/19 0013 09/12/19 0232 09/12/19 0246  HGB  --   --  13.9  --   HCT  --   --  40.4  --   PLT  --   --  162  --   HEPARINUNFRC  --   --  0.28*  --   CREATININE  --   --   --  0.93  TROPONINIHS 5 5  --   --     Estimated Creatinine Clearance: 76.9 mL/min (by C-G formula based on SCr of 0.93 mg/dL).   Assessment: 72 yoM transferred from OSH with CP and concern for ACS after high risk stress test. , no IV heparin started prior to transfer. No AC noted PTA.  Heparin level slightly subtherapeutic (0.28) on gtt at 750 units/hr. No issues with line or bleeding reported per RN. Heparin drip increased 900 uts/hr this am then heparin off for cath - 3V CAD plan CABG  Restart heparin drip 900 uts/hr 8hr post sheath removed - out at 1130 restart at 1930  Goal of Therapy:  Heparin level 0.3-0.7 units/ml Monitor platelets by anticoagulation protocol: Yes   Plan:  Restart  heparin to 900 units/hr at 8pm tonight  Daily Hl, CBC   Bonnita Nasuti Pharm.D. CPP, BCPS Clinical Pharmacist [phone] 09/12/2019 12:48 PM

## 2019-09-12 NOTE — Interval H&P Note (Signed)
Cath Lab Visit (complete for each Cath Lab visit)  Clinical Evaluation Leading to the Procedure:   ACS: No.  Non-ACS:    Anginal Classification: CCS II  Anti-ischemic medical therapy: No Therapy  Non-Invasive Test Results: Intermediate-risk stress test findings: cardiac mortality 1-3%/year  Prior CABG: No previous CABG      History and Physical Interval Note:  09/12/2019 10:50 AM  Russell Harris  has presented today for surgery, with the diagnosis of unstable angina.  The various methods of treatment have been discussed with the patient and family. After consideration of risks, benefits and other options for treatment, the patient has consented to  Procedure(s): LEFT HEART CATH AND CORONARY ANGIOGRAPHY (N/A) as a surgical intervention.  The patient's history has been reviewed, patient examined, no change in status, stable for surgery.  I have reviewed the patient's chart and labs.  Questions were answered to the patient's satisfaction.     Shelva Majestic

## 2019-09-12 NOTE — Progress Notes (Signed)
Pt did not want to watch the pre-cath video. All questions were answered & pt stated he understood the procedure but did not wish to view the video. Instructions given on how to pull up the video if pt changed his mind later. Pt instructed not to have anything to eat or drink after midnight, that he would nee another IV site & that he would need to have his rt wrist, & rt groin shaved for his procedure. Hoover Brunette, RN

## 2019-09-12 NOTE — Progress Notes (Signed)
PROGRESS NOTE        PATIENT DETAILS Name: Russell Harris Age: 53 y.o. Sex: male Date of Birth: 1966-02-11 Admit Date: 09/11/2019 Admitting Physician Mercy Riding, MD [id number], Sharon Mt, MD  Brief Narrative: Patient is a 53 y.o. male with history of Hodgkin's lymphoma-currently in remission, COPD, tobacco use-who presented as a transfer from [facility] for a positive stress test which was performed after patient presented with chest pain.  Subjective: Lying comfortably in bed-no chest pain or shortness of breath.  Assessment/Plan: Chest pain secondary to unstable angina: Large anterior wall defect on stress test-cardiology following with plans for LHC today.  Remains on aspirin, and heparin infusion.  History of Hodgkin's lymphoma: In remission-patient to resume follow-up with oncology in the outpatient setting  COPD: No sign of exacerbation-continue supportive care  Tobacco use: Counseled  Diet: Diet Order            Diet NPO time specified Except for: Sips with Meds  Diet effective midnight               DVT Prophylaxis: Full dose anticoagulation with Heparin  Code Status: Full code  Family Communication: None at bedside  Disposition Plan: Remain inpatient  Barriers to discharge: Positive stress test-needing left heart catheterization before making discharge plans.  Antimicrobial agents: Anti-infectives (From admission, onward)   None      Procedures: none  CONSULTS:  cardiology  Time spent: 25 minutes-Greater than 50% of this time was spent in counseling, explanation of diagnosis, planning of further management, and coordination of care.  MEDICATIONS: Scheduled Meds: . nicotine  21 mg Transdermal Daily  . sodium chloride flush  3 mL Intravenous Q12H   Continuous Infusions: . sodium chloride    . sodium chloride 1 mL/kg/hr (09/12/19 0528)  . heparin 900 Units/hr (09/12/19 0443)   PRN Meds:.sodium chloride,  acetaminophen, ondansetron (ZOFRAN) IV, sodium chloride flush   PHYSICAL EXAM: Vital signs: Vitals:   09/11/19 2115 09/12/19 0300 09/12/19 0500 09/12/19 0535  BP: 119/71     Pulse: 60     Resp: 20   18  Temp: 98.4 F (36.9 C)   98 F (36.7 C)  TempSrc: Oral   Oral  SpO2: 99%     Weight:  60.6 kg    Height:   6' (1.829 m)    Filed Weights   09/11/19 2000 09/12/19 0300  Weight: 61 kg 60.6 kg   Body mass index is 18.12 kg/m.   Gen Exam:Alert awake-not in any distress HEENT:atraumatic, normocephalic Chest: B/L clear to auscultation anteriorly CVS:S1S2 regular Abdomen:soft non tender, non distended Extremities:no edema Neurology: Non focal Skin: no rash  I have personally reviewed following labs and imaging studies  LABORATORY DATA: CBC: Recent Labs  Lab 09/12/19 0232  WBC 9.8  HGB 13.9  HCT 40.4  MCV 96.4  PLT 0000000    Basic Metabolic Panel: Recent Labs  Lab 09/12/19 0246  NA 140  K 3.7  CL 103  CO2 25  GLUCOSE 75  BUN 9  CREATININE 0.93  CALCIUM 9.4    GFR: Estimated Creatinine Clearance: 76.9 mL/min (by C-G formula based on SCr of 0.93 mg/dL).  Liver Function Tests: No results for input(s): AST, ALT, ALKPHOS, BILITOT, PROT, ALBUMIN in the last 168 hours. No results for input(s): LIPASE, AMYLASE in the last 168 hours. No  results for input(s): AMMONIA in the last 168 hours.  Coagulation Profile: No results for input(s): INR, PROTIME in the last 168 hours.  Cardiac Enzymes: No results for input(s): CKTOTAL, CKMB, CKMBINDEX, TROPONINI in the last 168 hours.  BNP (last 3 results) No results for input(s): PROBNP in the last 8760 hours.  HbA1C: No results for input(s): HGBA1C in the last 72 hours.  CBG: No results for input(s): GLUCAP in the last 168 hours.  Lipid Profile: No results for input(s): CHOL, HDL, LDLCALC, TRIG, CHOLHDL, LDLDIRECT in the last 72 hours.  Thyroid Function Tests: No results for input(s): TSH, T4TOTAL, FREET4,  T3FREE, THYROIDAB in the last 72 hours.  Anemia Panel: No results for input(s): VITAMINB12, FOLATE, FERRITIN, TIBC, IRON, RETICCTPCT in the last 72 hours.  Urine analysis: No results found for: COLORURINE, APPEARANCEUR, LABSPEC, PHURINE, GLUCOSEU, HGBUR, BILIRUBINUR, KETONESUR, PROTEINUR, UROBILINOGEN, NITRITE, LEUKOCYTESUR  Sepsis Labs: Lactic Acid, Venous No results found for: LATICACIDVEN  MICROBIOLOGY: Recent Results (from the past 240 hour(s))  SARS Coronavirus 2 [facility] order, Performed in [facility] hospital lab) Nasopharyngeal Nasopharyngeal Swab     Status: None   Collection Time: 09/12/19  5:12 AM   Specimen: Nasopharyngeal Swab  Result Value Ref Range Status   SARS Coronavirus 2 NEGATIVE NEGATIVE Final    Comment: (NOTE) If result is NEGATIVE SARS-CoV-2 target nucleic acids are NOT DETECTED. The SARS-CoV-2 RNA is generally detectable in upper and lower  respiratory specimens during the acute phase of infection. The lowest  concentration of SARS-CoV-2 viral copies this assay can detect is 250  copies / mL. A negative result does not preclude SARS-CoV-2 infection  and should not be used as the sole basis for treatment or other  patient management decisions.  A negative result may occur with  improper specimen collection / handling, submission of specimen other  than nasopharyngeal swab, presence of viral mutation(s) within the  areas targeted by this assay, and inadequate number of viral copies  (<250 copies / mL). A negative result must be combined with clinical  observations, patient history, and epidemiological information. If result is POSITIVE SARS-CoV-2 target nucleic acids are DETECTED. The SARS-CoV-2 RNA is generally detectable in upper and lower  respiratory specimens dur ing the acute phase of infection.  Positive  results are indicative of active infection with SARS-CoV-2.  Clinical  correlation with patient history and other diagnostic information is   necessary to determine patient infection status.  Positive results do  not rule out bacterial infection or co-infection with other viruses. If result is PRESUMPTIVE POSTIVE SARS-CoV-2 nucleic acids MAY BE PRESENT.   A presumptive positive result was obtained on the submitted specimen  and confirmed on repeat testing.  While 2019 novel coronavirus  (SARS-CoV-2) nucleic acids may be present in the submitted sample  additional confirmatory testing may be necessary for epidemiological  and / or clinical management purposes  to differentiate between  SARS-CoV-2 and other Sarbecovirus currently known to infect humans.  If clinically indicated additional testing with an alternate test  methodology [phone]) is advised. The SARS-CoV-2 RNA is generally  detectable in upper and lower respiratory sp ecimens during the acute  phase of infection. The expected result is Negative. Fact Sheet for Patients:  StrictlyIdeas.no Fact Sheet for Healthcare Providers: BankingDealers.co.za This test is not yet approved or cleared by the [place] FDA and has been authorized for detection and/or diagnosis of SARS-CoV-2 by FDA under an Emergency Use Authorization (EUA).  This EUA will remain in effect (  meaning this test can be used) for the duration of the COVID-19 declaration under Section 564(b)(1) of the Act, 21 U.S.C. section 360bbb-3(b)(1), unless the authorization is terminated or revoked sooner. Performed at [facility], [place] [address], [place], [place] [address]     RADIOLOGY STUDIES/RESULTS: No results found.   LOS: 1 day   Oren Binet, MD  [facility]  If 7PM-7AM, please contact night-coverage  Please page via [email or web address]  Go to [email or web address] and use [place]'s universal password to access. If you do not have the password, please contact the hospital operator.  Locate the [facility] provider you are looking for under [facility] and page to a number that you can be directly reached. If you still have difficulty reaching the provider, please page the [facility] (Director on Call) for the Hospitalists listed on amion for assistance.  09/12/2019, 10:19 AM

## 2019-09-12 NOTE — Progress Notes (Signed)
Progress Note  Patient Name: Russell Harris Date of Encounter: 09/12/2019  Primary Cardiologist: No primary care provider on file.   Subjective   Pt has had no further chest pain or shortness of breath. He is planned for cardiac cath today, NPO. Has heparin infusing.   Inpatient Medications    Scheduled Meds: . nicotine  21 mg Transdermal Daily  . sodium chloride flush  3 mL Intravenous Q12H   Continuous Infusions: . sodium chloride    . sodium chloride 1 mL/kg/hr (09/12/19 0528)  . heparin 900 Units/hr (09/12/19 0443)   PRN Meds: sodium chloride, acetaminophen, ondansetron (ZOFRAN) IV, sodium chloride flush   Vital Signs    Vitals:   09/11/19 2115 09/12/19 0300 09/12/19 0500 09/12/19 0535  BP: 119/71     Pulse: 60     Resp: 20   18  Temp: 98.4 F (36.9 C)   98 F (36.7 C)  TempSrc: Oral   Oral  SpO2: 99%     Weight:  60.6 kg    Height:   6' (1.829 m)     Intake/Output Summary (Last 24 hours) at 09/12/2019 0845 Last data filed at 09/12/2019 0829 Gross per 24 hour  Intake 725.26 ml  Output 925 ml  Net -199.74 ml   Last 3 Weights 09/12/2019 09/11/2019  Weight (lbs) 133 lb 9.6 oz 134 lb 7.7 oz  Weight (kg) 60.6 kg 61 kg      Telemetry    Sinus bradycardia in the 50's - Personally Reviewed  ECG    Normal sinus rhythm, 69 bpm, Incomplete right bundle branch block, Left anterior fascicular block, Anteroseptal infarct , age undetermined, QTC 51 - Personally Reviewed  Physical Exam   GEN: No acute distress.   Neck: No JVD Cardiac: RRR, no murmurs, rubs, or gallops.  Respiratory: Clear to auscultation bilaterally. GI: Soft, nontender, non-distended  MS: No edema; No deformity. Neuro:  Nonfocal  Psych: Normal affect   Labs    High Sensitivity Troponin:   Recent Labs  Lab 09/11/19 2148 09/12/19 0013  TROPONINIHS 5 5      Chemistry Recent Labs  Lab 09/12/19 0246  NA 140  K 3.7  CL 103  CO2 25  GLUCOSE 75  BUN 9  CREATININE 0.93  CALCIUM  9.4  GFRNONAA >60  GFRAA >60  ANIONGAP 12     Hematology Recent Labs  Lab 09/12/19 0232  WBC 9.8  RBC 4.19*  HGB 13.9  HCT 40.4  MCV 96.4  MCH 33.2  MCHC 34.4  RDW 12.9  PLT 162    BNPNo results for input(s): BNP, PROBNP in the last 168 hours.   DDimer No results for input(s): DDIMER in the last 168 hours.   Radiology    No results found.  Cardiac Studies   Outside Myoview obtained on 09/10/2018 Normal EF, large anterior ischemia  Patient Profile     53 y.o. male male with a hx of  tobacco abuse, Hodgkin's lymphoma diagnosed in 1997 s/p 6 months of chemotherapy, RBBB and COPD who is being seen for the evaluation of abnormal stress test performed at [facility] for chest pain ruled out for MI, transferred here for cardiac cath.   Assessment & Plan    Chest pain -Large anterior ischemia noted on Myoview at [facility].  EF was normal.  Plan to proceed with cardiac catheterization today. Heparin drip is infusing. -Currently chest pain free.  -Will check lipid panel for risk stratification  Tobacco  abuse -Pt will need to stop smoking  COPD -Currently stable  Hodgkin's lymphoma -He is currently not being followed by a oncologist.  Confirmed by biopsy according to the patient in 1997, finished 24-month course of chemotherapy.       For questions or updates, please contact [place] Please consult [email or web address] for contact info under        Signed, Daune Perch, NP  09/12/2019, 8:45 AM

## 2019-09-12 NOTE — Consult Note (Signed)
[address]       [place] [address]             [id number]        Harman E Heater [place] Medical Record [id number] Date of Birth: 1966-09-04  Referring: No ref. provider found Primary Care: Street, Sharon Mt, MD Primary Cardiologist:No primary care provider on file.  Chief Complaint:   No chief complaint on file.   History of Present Illness:       Mr. Quinterrius Meuth is a 53 year old male with a past medical history significant for tobacco abuse, hypertension, Hodgkin Lymphoma s/p Chemotherapy treatment in 1997, chronic back pain, and COPD who was admitted after having a positive nuclear medicine perfusion imaging study concerning for anterior wall ischemia. He was having what he thought was indigestion on Tuesday night which continued into Wednesday. On arrival  his EKG on arrival showed normal sinus rhythm in the 60s with a an incomplete right bundle branch block with possible left anterior fascicular block and questionable anterior septal infarct.  High-sensitivity troponin was 5.  A cardiac catheterization was performed which showed a 20% proximal to mid RCA stenosis, 50% stenosis of the ostial to proximal circumflex, 90% stenosis of the first diagonal lesion, 70% stenosis of the proximal to mid LAD, and 99% stenosis of the mid to distal LAD with 99% stenosis of the second diagonal.  Estimated ejection fraction was 50 to 55%.  We are consulted for possible surgical revascularization.  He is on disability for his chronic back pain. He lives alone and gets around okay. He takes care of himself and his residence without help.    Current Activity/ Functional Status: Patient was independent with mobility/ambulation, transfers, ADL's, IADL's.   Zubrod Score: At the time of surgery this patient's most appropriate activity status/level should be described as: []     0    Normal activity, no symptoms [x]     1    Restricted in physical strenuous activity but  ambulatory, able to do out light work []     2    Ambulatory and capable of self care, unable to do work activities, up and about                 more than 50%  Of the time                            []     3    Only limited self care, in bed greater than 50% of waking hours []     4    Completely disabled, no self care, confined to bed or chair []     5    Moribund  Past Medical History:  Diagnosis Date  . COPD (chronic obstructive pulmonary disease) ([place])   . Hodgkin lymphoma ([place])    1997, s/p chemo  . RBBB   . Tobacco abuse     Past Surgical History:  Procedure Laterality Date  . LYMPH NODE BIOPSY     of neck, diagnosed with hodgkin's lymphoma in 1997, s/p 6 month of chemo    Social History   Tobacco Use  Smoking Status Current Every Day Smoker  . Types: Cigarettes  Smokeless Tobacco Never Used    Social History   Substance and Sexual Activity  Alcohol Use Yes   Comment: rarely     Allergies  Allergen Reactions  . Ketorolac Hives  .  Tramadol Hives    Current Facility-Administered Medications  Medication Dose Route Frequency Provider Last Rate Last Dose  . 0.9 %  sodium chloride infusion   Intravenous Continuous Troy Sine, MD      . 0.9 %  sodium chloride infusion  250 mL Intravenous PRN Troy Sine, MD      . acetaminophen (TYLENOL) tablet 650 mg  650 mg Oral Q4H PRN Elwyn Reach, MD      . acetaminophen (TYLENOL) tablet 650 mg  650 mg Oral Q4H PRN Troy Sine, MD      . Derrill Memo ON 09/13/2019] aspirin chewable tablet 81 mg  81 mg Oral Daily Troy Sine, MD      . atorvastatin (LIPITOR) tablet 80 mg  80 mg Oral q1800 Troy Sine, MD      . diazepam (VALIUM) tablet 5 mg  5 mg Oral Q6H PRN Troy Sine, MD      . heparin ADULT infusion 100 units/mL (25000 units/267mL sodium chloride 0.45%)  900 Units/hr Intravenous Continuous Ghimire, Henreitta Leber, MD      . hydrALAZINE (APRESOLINE) injection 10 mg  10 mg Intravenous Q20 Min PRN Troy Sine, MD      . isosorbide mononitrate (IMDUR) 24 hr tablet 30 mg  30 mg Oral Daily Troy Sine, MD      . labetalol (NORMODYNE) injection 10 mg  10 mg Intravenous Q10 min PRN Troy Sine, MD      . metoprolol tartrate (LOPRESSOR) tablet 12.5 mg  12.5 mg Oral BID Troy Sine, MD      . nicotine (NICODERM CQ - dosed in mg/24 hours) patch 21 mg  21 mg Transdermal Daily Gala Romney L, MD   21 mg at 09/12/19 1003  . ondansetron (ZOFRAN) injection 4 mg  4 mg Intravenous Q6H PRN Gala Romney L, MD      . ondansetron (ZOFRAN) injection 4 mg  4 mg Intravenous Q6H PRN Troy Sine, MD      . sodium chloride flush (NS) 0.9 % injection 3 mL  3 mL Intravenous Q12H Almyra Deforest, PA   3 mL at 09/11/19 2100  . sodium chloride flush (NS) 0.9 % injection 3 mL  3 mL Intravenous PRN Troy Sine, MD        Medications Prior to Admission  Medication Sig Dispense Refill Last Dose  . acetaminophen (TYLENOL) 325 MG tablet Take 650 mg by mouth every 6 (six) hours as needed for mild pain or headache.   Past Week at Unknown time    Family History  Problem Relation Age of Onset  . Aneurysm Father        of brain  . Heart attack Sister        before age 59  . Heart attack Brother        2 brothers had MI after age 81s     Review of Systems:   Review of Systems  Constitutional: Negative for fever, malaise/fatigue and weight loss.  HENT: Positive for hearing loss (HOH).   Cardiovascular: Positive for chest pain. Negative for palpitations and leg swelling.  Gastrointestinal: Positive for heartburn. Negative for abdominal pain.  Musculoskeletal: Positive for back pain (chronic and on disability).  Psychiatric/Behavioral: The patient is not nervous/anxious.    Pertinent items are noted in HPI.    Physical Exam: BP (!) 105/59 (BP Location: Left Arm)   Pulse 68   Temp 98 F (36.7  C) (Oral)   Resp 18   Ht 6' (1.829 m)   Wt 60.6 kg   SpO2 96%   BMI 18.12 kg/m    General appearance:  alert, cooperative and no distress Resp: clear to auscultation bilaterally Cardio: regular rate and rhythm, S1, S2 normal, no murmur, click, rub or gallop GI: soft, non-tender; bowel sounds normal; no masses,  no organomegaly Extremities: extremities normal, atraumatic, no cyanosis or edema Neurologic: Grossly normal  Diagnostic Studies & Laboratory data:   Mid RCA lesion is 20% stenosed.  Prox RCA lesion is 20% stenosed.  Ost Cx to Prox Cx lesion is 50% stenosed.  Prox Cx lesion is 50% stenosed.  1st Mrg lesion is 50% stenosed.  1st Diag lesion is 90% stenosed.  Prox LAD to Mid LAD lesion is 70% stenosed.  Mid LAD to Dist LAD lesion is 99% stenosed with 99% stenosed side branch in 2nd Diag.   Multivessel CAD with moderate coronary calcification and evidence for diffuse chronic appearing long subtotal LAD stenosis with 90% ostial narrowing in the proximal diagonal vessel and subtotal second diagonal stenosis.  There are collaterals septal perforating arteries from the day supplying the mid distal vessel as well as collateralization from the circumflex marginal branch apically.  The circumflex vessel appears to have an ulcerated proximal plaque with residual stenosis of 50% followed by 50% bifurcation stenosis of the circumflex at the takeoff of the OM vessel and ostially involving the OM vessel.  The RCA is a dominant vessel with mild luminal irregularity and mid stenoses of 20%.  There is significant septal collateralization to the mid distal LAD.  Low normal global LV contractility with suggestion of possible distal anterolateral hypocontractility.  EF estimate at 50 to 55%.  LVEDP 9 mm.  RECOMMENDATION: The LAD lesion does not appear acute and most likely is chronic.  With the extensive calcified LAD and diagonal disease as well as probable ostial ulcerated plaque in the circumflex with bifurcation circumflex/OM stenoses consider surgical consultation for CABG  revascularization surgery.  Initiate high potency statin therapy.  Will initiate beta-blocker therapy as well as low-dose nitrates.     Recent Radiology Findings:   No results found.   I have independently reviewed the above radiologic studies and discussed with the patient   Recent Lab Findings: Lab Results  Component Value Date   WBC 9.8 09/12/2019   HGB 13.9 09/12/2019   HCT 40.4 09/12/2019   PLT 162 09/12/2019   GLUCOSE 75 09/12/2019   CHOL 191 09/12/2019   TRIG 55 09/12/2019   HDL 51 09/12/2019   LDLCALC 129 (H) 09/12/2019   NA 140 09/12/2019   K 3.7 09/12/2019   CL 103 09/12/2019   CREATININE 0.93 09/12/2019   BUN 9 09/12/2019   CO2 25 09/12/2019      Assessment / Plan:      1. 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 LAD lesions appears chronic. Continue medical management with ASA, BB, and low-dose nitrates. No current chest pain.  2. Hypertension-Currently on Lopressor BID, Imdur, and PRN labetalol and hydralazine.  3. Continue Lipitor 80mg  daily  4. Tobacco abuse- continue nicotine patch. Counseled the patient about cessation.  5. Chronic back pain 6. COPD-not on any inhaled medications  Plan: No current chest pain. Will need an Echocardiogram and the usually preop workup. The procedure of coronary artery disease was discussed with the patient. All questions were answered to the patient's satisfaction. He would like to discuss surgery with a family member before  he makes a decision. Tentatively surgery with Dr. Orvan Seen for Tuesday 9/22.     I  spent 40 minutes counseling the patient face to face.   Nicholes Rough, PA-C 09/12/2019 1:12 PM

## 2019-09-12 NOTE — Progress Notes (Signed)
TCTS consulted for CABG evaluation. °

## 2019-09-12 NOTE — Progress Notes (Signed)
ANTICOAGULATION CONSULT NOTE  Pharmacy Consult for heparin Indication: chest pain/ACS  Not on File  Patient Measurements: Weight: 134 lb 7.7 oz (61 kg) Heparin Dosing Weight: 61.5kg  Vital Signs: Temp: 98.4 F (36.9 C) (09/17 2115) Temp Source: Oral (09/17 2115) BP: 119/71 (09/17 2115) Pulse Rate: 60 (09/17 2115)  Labs: Recent Labs    09/11/19 2148 09/12/19 0013 09/12/19 0232  HGB  --   --  13.9  HCT  --   --  40.4  PLT  --   --  162  HEPARINUNFRC  --   --  0.28*  TROPONINIHS 5 5  --     CrCl cannot be calculated (No successful lab value found.).   Assessment: 56 yoM transferred from OSH with CP and concern for ACS after high risk stress test. Wt this morning was 61.5 kg, no IV heparin started prior to transfer. No AC noted PTA.  Heparin level slightly subtherapeutic (0.28) on gtt at 750 units/hr. No issues with line or bleeding reported per RN.  Goal of Therapy:  Heparin level 0.3-0.7 units/ml Monitor platelets by anticoagulation protocol: Yes   Plan:  Increase heparin to 900 units/hr F/u post cath  Sherlon Handing, PharmD, BCPS 09/12/2019

## 2019-09-12 NOTE — Progress Notes (Signed)
  Echocardiogram 2D Echocardiogram has been performed.  Russell Harris 09/12/2019, 3:41 PM

## 2019-09-12 NOTE — H&P (View-Only) (Signed)
Progress Note  Patient Name: Russell Harris Date of Encounter: 09/12/2019  Primary Cardiologist: No primary care provider on file.   Subjective   Pt has had no further chest pain or shortness of breath. He is planned for cardiac cath today, NPO. Has heparin infusing.   Inpatient Medications    Scheduled Meds: . nicotine  21 mg Transdermal Daily  . sodium chloride flush  3 mL Intravenous Q12H   Continuous Infusions: . sodium chloride    . sodium chloride 1 mL/kg/hr (09/12/19 0528)  . heparin 900 Units/hr (09/12/19 0443)   PRN Meds: sodium chloride, acetaminophen, ondansetron (ZOFRAN) IV, sodium chloride flush   Vital Signs    Vitals:   09/11/19 2115 09/12/19 0300 09/12/19 0500 09/12/19 0535  BP: 119/71     Pulse: 60     Resp: 20   18  Temp: 98.4 F (36.9 C)   98 F (36.7 C)  TempSrc: Oral   Oral  SpO2: 99%     Weight:  60.6 kg    Height:   6' (1.829 m)     Intake/Output Summary (Last 24 hours) at 09/12/2019 0845 Last data filed at 09/12/2019 0829 Gross per 24 hour  Intake 725.26 ml  Output 925 ml  Net -199.74 ml   Last 3 Weights 09/12/2019 09/11/2019  Weight (lbs) 133 lb 9.6 oz 134 lb 7.7 oz  Weight (kg) 60.6 kg 61 kg      Telemetry    Sinus bradycardia in the 50's - Personally Reviewed  ECG    Normal sinus rhythm, 69 bpm, Incomplete right bundle branch block, Left anterior fascicular block, Anteroseptal infarct , age undetermined, QTC 78 - Personally Reviewed  Physical Exam   GEN: No acute distress.   Neck: No JVD Cardiac: RRR, no murmurs, rubs, or gallops.  Respiratory: Clear to auscultation bilaterally. GI: Soft, nontender, non-distended  MS: No edema; No deformity. Neuro:  Nonfocal  Psych: Normal affect   Labs    High Sensitivity Troponin:   Recent Labs  Lab 09/11/19 2148 09/12/19 0013  TROPONINIHS 5 5      Chemistry Recent Labs  Lab 09/12/19 0246  NA 140  K 3.7  CL 103  CO2 25  GLUCOSE 75  BUN 9  CREATININE 0.93  CALCIUM  9.4  GFRNONAA >60  GFRAA >60  ANIONGAP 12     Hematology Recent Labs  Lab 09/12/19 0232  WBC 9.8  RBC 4.19*  HGB 13.9  HCT 40.4  MCV 96.4  MCH 33.2  MCHC 34.4  RDW 12.9  PLT 162    BNPNo results for input(s): BNP, PROBNP in the last 168 hours.   DDimer No results for input(s): DDIMER in the last 168 hours.   Radiology    No results found.  Cardiac Studies   Outside Myoview obtained on 09/10/2018 Normal EF, large anterior ischemia  Patient Profile     53 y.o. male male with a hx of  tobacco abuse, Hodgkin's lymphoma diagnosed in 1997 s/p 6 months of chemotherapy, RBBB and COPD who is being seen for the evaluation of abnormal stress test performed at [facility] for chest pain ruled out for MI, transferred here for cardiac cath.   Assessment & Plan    Chest pain -Large anterior ischemia noted on Myoview at [facility].  EF was normal.  Plan to proceed with cardiac catheterization today. Heparin drip is infusing. -Currently chest pain free.  -Will check lipid panel for risk stratification  Tobacco  abuse -Pt will need to stop smoking  COPD -Currently stable  Hodgkin's lymphoma -He is currently not being followed by a oncologist.  Confirmed by biopsy according to the patient in 1997, finished 35-month course of chemotherapy.       For questions or updates, please contact [place] Please consult [email or web address] for contact info under        Signed, Daune Perch, NP  09/12/2019, 8:45 AM

## 2019-09-13 ENCOUNTER — Inpatient Hospital Stay (HOSPITAL_COMMUNITY): Payer: Medicare HMO

## 2019-09-13 DIAGNOSIS — I25119 Atherosclerotic heart disease of native coronary artery with unspecified angina pectoris: Secondary | ICD-10-CM

## 2019-09-13 DIAGNOSIS — E782 Mixed hyperlipidemia: Secondary | ICD-10-CM

## 2019-09-13 DIAGNOSIS — Z01810 Encounter for preprocedural cardiovascular examination: Secondary | ICD-10-CM

## 2019-09-13 LAB — HEPARIN LEVEL (UNFRACTIONATED)
Heparin Unfractionated: 0.18 IU/mL — ABNORMAL LOW (ref 0.30–0.70)
Heparin Unfractionated: 0.26 IU/mL — ABNORMAL LOW (ref 0.30–0.70)
Heparin Unfractionated: 0.23 IU/mL — ABNORMAL LOW (ref 0.30–0.70)

## 2019-09-13 LAB — BASIC METABOLIC PANEL
Anion gap: 10 (ref 5–15)
BUN: 13 mg/dL (ref 6–20)
CO2: 23 mmol/L (ref 22–32)
Calcium: 9.2 mg/dL (ref 8.9–10.3)
Chloride: 105 mmol/L (ref 98–111)
Creatinine, Ser: 1.04 mg/dL (ref 0.61–1.24)
GFR calc Af Amer: >60 mL/min (ref >60)
GFR calc non Af Amer: >60 mL/min (ref >60)
Glucose, Bld: 81 mg/dL (ref 70–99)
Potassium: 3.8 mmol/L (ref 3.5–5.1)
Sodium: 138 mmol/L (ref 135–145)

## 2019-09-13 LAB — CBC
HCT: 39.2 % (ref 39.0–52.0)
Hemoglobin: 13.7 g/dL (ref 13.0–17.0)
MCH: 33.7 pg (ref 26.0–34.0)
MCHC: 34.9 g/dL (ref 30.0–36.0)
MCV: 96.3 fL (ref 80.0–100.0)
Platelets: 174 10*3/uL (ref 150–400)
RBC: 4.07 MIL/uL — ABNORMAL LOW (ref 4.22–5.81)
RDW: 12.8 % (ref 11.5–15.5)
WBC: 12.7 10*3/uL — ABNORMAL HIGH (ref 4.0–10.5)
nRBC: 0 % (ref 0.0–0.2)

## 2019-09-13 NOTE — Progress Notes (Signed)
Pt in Sinus Brady this am, HR 40's-50's, pt is asymptomatic. Will continue to monitor pt.

## 2019-09-13 NOTE — Progress Notes (Signed)
[place] for heparin Indication: chest pain/ACS  Allergies  Allergen Reactions  . Ketorolac Hives  . Tramadol Hives    Patient Measurements: Height: 6' (182.9 cm) Weight: 133 lb 9.6 oz (60.6 kg) IBW/kg (Calculated) : 77.6 Heparin Dosing Weight: 61.5kg  Vital Signs: Temp: 97.9 F (36.6 C) (09/19 0514) Temp Source: Oral (09/19 0514) BP: 88/62 (09/19 0514) Pulse Rate: 50 (09/19 0514)  Labs: Recent Labs    09/11/19 2148 09/12/19 0013 09/12/19 0232 09/12/19 0246 09/13/19 0441  HGB  --   --  13.9  --  13.7  HCT  --   --  40.4  --  39.2  PLT  --   --  162  --  174  HEPARINUNFRC  --   --  0.28*  --  0.18*  CREATININE  --   --   --  0.93 1.04  TROPONINIHS 5 5  --   --   --     Estimated Creatinine Clearance: 68.8 mL/min (by C-G formula based on SCr of 1.04 mg/dL).   Assessment: 30 yoM transferred from OSH with CP and concern for ACS after high risk stress test. s/p cath - 3V CAD plan CABG. Heparin restarted post sheath removal.   Heparin level subtherapeutic (0.18) on gtt at 900 units/hr. No issues with line or bleeding reported per RN.  Goal of Therapy:  Heparin level 0.3-0.7 units/ml Monitor platelets by anticoagulation protocol: Yes   Plan:  Increase heparin gtt to 1100 units/hr F/u 6 hr heparin level  Sherlon Handing, PharmD, BCPS 09/13/2019 6:32 AM

## 2019-09-13 NOTE — Progress Notes (Signed)
VASCULAR LAB PRELIMINARY  PRELIMINARY  PRELIMINARY  PRELIMINARY  Pre CABG Dopplers completed.    Preliminary report:  See CV proc for preliminary results.   Lynkin Saini, RVT 09/13/2019, 4:11 PM

## 2019-09-13 NOTE — Progress Notes (Addendum)
Progress Note  Patient Name: Russell Harris Date of Encounter: 09/13/2019  Primary Cardiologist:  Tia Alert (new)  Subjective   No chest pain, shortness of breath, palpitations, abdominal pain.  Inpatient Medications    Scheduled Meds:  aspirin  81 mg Oral Daily   atorvastatin  80 mg Oral q1800   isosorbide mononitrate  30 mg Oral Daily   metoprolol tartrate  12.5 mg Oral BID   nicotine  21 mg Transdermal Daily   sodium chloride flush  3 mL Intravenous Q12H   Continuous Infusions:  sodium chloride Stopped (09/12/19 1950)   sodium chloride 250 mL (09/12/19 1950)   heparin 1,100 Units/hr (09/13/19 0641)   PRN Meds: sodium chloride, acetaminophen, acetaminophen, diazepam, ondansetron (ZOFRAN) IV, ondansetron (ZOFRAN) IV, sodium chloride flush   Vital Signs    Vitals:   09/12/19 2038 09/12/19 2244 09/13/19 0514 09/13/19 0530  BP: (!) 96/58 103/65  (!) 94/57  Pulse: 72 61  (!) 50  Resp:    20  Temp:    97.9 F (36.6 C)  TempSrc:    Oral  SpO2: 96%   96%  Weight:   60.6 kg   Height:        Intake/Output Summary (Last 24 hours) at 09/13/2019 1053 Last data filed at 09/13/2019 0600 Gross per 24 hour  Intake 431.56 ml  Output 925 ml  Net -493.44 ml   Filed Weights   09/11/19 2000 09/12/19 0300 09/13/19 0514  Weight: 61 kg 60.6 kg 60.6 kg    Telemetry    Sinus bradycardia, heart rate into the 40s.  Personally reviewed.  ECG    An ECG dated 09/12/2019 was personally reviewed today and demonstrated:  Sinus rhythm with IVCD, left anterior fascicular block, possible old anterior infarct pattern.  Physical Exam   GEN: No acute distress.   Neck: No JVD. Cardiac: RRR, no gallop.  Respiratory: Nonlabored. Clear to auscultation bilaterally. GI: Soft, nontender, bowel sounds present. MS: No edema; No deformity. Neuro:  Nonfocal. Psych: Alert and oriented x 3. Normal affect.  Labs    Chemistry Recent Labs  Lab 09/12/19 0246 09/13/19 0441  NA 140 138    K 3.7 3.8  CL 103 105  CO2 25 23  GLUCOSE 75 81  BUN 9 13  CREATININE 0.93 1.04  CALCIUM 9.4 9.2  GFRNONAA >60 >60  GFRAA >60 >60  ANIONGAP 12 10     Hematology Recent Labs  Lab 09/12/19 0232 09/13/19 0441  WBC 9.8 12.7*  RBC 4.19* 4.07*  HGB 13.9 13.7  HCT 40.4 39.2  MCV 96.4 96.3  MCH 33.2 33.7  MCHC 34.4 34.9  RDW 12.9 12.8  PLT 162 174    Cardiac Enzymes Recent Labs  Lab 09/11/19 2148 09/12/19 0013  TROPONINIHS 5 5    Radiology    Ct Chest Wo Contrast  Result Date: 09/13/2019 CLINICAL DATA:  Aortic disease. Preop CABG. History of chest radiation. EXAM: CT CHEST WITHOUT CONTRAST TECHNIQUE: Multidetector CT imaging of the chest was performed following the standard protocol without IV contrast. COMPARISON:  None. FINDINGS: Cardiovascular: The heart size is normal. There is no significant pericardial effusion. Coronary artery calcifications are noted. Mild aortic calcifications are noted. There is no evidence for a thoracic aortic aneurysm. Mediastinum/Nodes: --No mediastinal or hilar lymphadenopathy. --No axillary lymphadenopathy. --No supraclavicular lymphadenopathy. --Normal thyroid gland. --The esophagus is unremarkable Lungs/Pleura: There are paraseptal emphysematous changes bilaterally. Apical bulla are noted. There is a 7 mm pulmonary nodule in the  left upper lobe (axial series 8, image 23). There is no pneumothorax. There is no significant pleural effusion. There is a trace amount of debris within the lower trachea. Upper Abdomen: There is a branching hyperdense area within the partially visualized upper pole of the right kidney. This may represent contrast from a recent cardiac catheterization. Musculoskeletal: No chest wall abnormality. No acute or significant osseous findings. Review of the MIP images confirms the above findings. IMPRESSION: 1. No acute intrathoracic abnormality. 2. There is a 7 mm pulmonary nodule in the left upper lobe. Non-contrast chest CT at  6-12 months is recommended. If the nodule is stable at time of repeat CT, then future CT at 18-24 months (from today's scan) is considered optional for low-risk patients, but is recommended for high-risk patients. This recommendation follows the consensus statement: Guidelines for Management of Incidental Pulmonary Nodules Detected on CT Images: From the Fleischner Society 2017; Radiology 2017; 284:228-243. 3. Mild asymmetric collecting system dilatation on the right with evidence for retained contrast from the prior cardiac catheterization. As the right kidney is only partially visualized on this exam, follow-up with a nonemergent renal ultrasound is recommended for further evaluation of this finding. Aortic Atherosclerosis (ICD10-I70.0) and Emphysema (ICD10-J43.9). Electronically Signed   By: Constance Holster M.D.   On: 09/13/2019 00:17    Cardiac Studies   Echocardiogram 09/12/2019:  1. Left ventricular ejection fraction, by visual estimation, is 50 to 55%. The left ventricle has mildly decreased function. Normal left ventricular size. There is no left ventricular hypertrophy. False tendon in the left ventricle.  2. Basal and mid inferolateral wall is abnormal.  3. Elevated left ventricular end-diastolic pressure.  4. Left ventricular diastolic Doppler parameters are consistent with impaired relaxation pattern of LV diastolic filling.  5. Global right ventricle has mildly reduced systolic function.The right ventricular size is mildly enlarged. No increase in right ventricular wall thickness.  6. Left atrial size was normal.  7. Right atrial size was normal.  8. The mitral valve is normal in structure. No evidence of mitral valve regurgitation. No evidence of mitral stenosis.  9. The tricuspid valve is normal in structure. Tricuspid valve regurgitation was not visualized by color flow Doppler. 10. The aortic valve is normal in structure. Aortic valve regurgitation was not visualized by color flow  Doppler. Structurally normal aortic valve, with no evidence of sclerosis or stenosis. 11. The pulmonic valve was normal in structure. Pulmonic valve regurgitation is not visualized by color flow Doppler. 12. The inferior vena cava is normal in size with greater than 50% respiratory variability, suggesting right atrial pressure of 3 mmHg.  Cardiac catheterization 09/12/2019:  Mid RCA lesion is 20% stenosed.  Prox RCA lesion is 20% stenosed.  Ost Cx to Prox Cx lesion is 50% stenosed.  Prox Cx lesion is 50% stenosed.  1st Mrg lesion is 50% stenosed.  1st Diag lesion is 90% stenosed.  Prox LAD to Mid LAD lesion is 70% stenosed.  Mid LAD to Dist LAD lesion is 99% stenosed with 99% stenosed side branch in 2nd Diag.   Multivessel CAD with moderate coronary calcification and evidence for diffuse chronic appearing long subtotal LAD stenosis with 90% ostial narrowing in the proximal diagonal vessel and subtotal second diagonal stenosis.  There are collaterals septal perforating arteries from the day supplying the mid distal vessel as well as collateralization from the circumflex marginal branch apically.  The circumflex vessel appears to have an ulcerated proximal plaque with residual stenosis of 50% followed by  50% bifurcation stenosis of the circumflex at the takeoff of the OM vessel and ostially involving the OM vessel.  The RCA is a dominant vessel with mild luminal irregularity and mid stenoses of 20%.  There is significant septal collateralization to the mid distal LAD.  Low normal global LV contractility with suggestion of possible distal anterolateral hypocontractility.  EF estimate at 50 to 55%.  LVEDP 9 mm.  RECOMMENDATION: The LAD lesion does not appear acute and most likely is chronic.  With the extensive calcified LAD and diagonal disease as well as probable ostial ulcerated plaque in the circumflex with bifurcation circumflex/OM stenoses consider surgical consultation for CABG  revascularization surgery.  Initiate high potency statin therapy.  Will initiate beta-blocker therapy as well as low-dose nitrates.  Patient Profile     53 y.o. male with a history of tobacco abuse, Hodgkin's lymphoma diagnosed in 1997 s/p 6 months of chemotherapy, RBBB and COPD now presenting after abnormal Myoview and diagnostic cardiac catheterization demonstrating multivessel CAD.  Assessment & Plan    1.  Multivessel CAD, most significant within the LAD distribution and chronic appearing with possible ulcerated proximal circumflex plaque and moderate stenosis in that distribution.  LVEF is in the range of 50 to 55% by echocardiography.  He has been seen by [facility] with plan for CABG on Tuesday.  2.  COPD with history of tobacco abuse.  Chest CT shows paraseptal emphysematous changes bilaterally and apical bulla.  3.  Mixed hyperlipidemia, LDL 129, now started on statin therapy.  4.  Previous history of Hodgkin's lymphoma status post treatment in the 1990s.  No regular follow-up for this.  5.  7 mm pulmonary nodule left upper lobe by recent CT with recommendation for 6 to 12 months follow-up.  Continue aspirin, Lipitor, Imdur, and IV heparin.  With heart rate in the 40s to 50s (also IVCD and left anterior fascicular block by ECG ) will stop Lopressor for now.  Continue with preoperative work-up in anticipation of CABG on Tuesday.  Signed, Rozann Lesches, MD  09/13/2019, 10:53 AM

## 2019-09-13 NOTE — Plan of Care (Signed)
  Problem: Cardiovascular: Goal: Vascular access site(s) Level 0-1 will be maintained Outcome: Completed/Met

## 2019-09-13 NOTE — Progress Notes (Signed)
PROGRESS NOTE        PATIENT DETAILS Name: Russell Harris Age: 53 y.o. Sex: male Date of Birth: 01-Oct-1966 Admit Date: 09/11/2019 Admitting Physician Mercy Riding, MD [id number], Sharon Mt, MD  Brief Narrative: Patient is a 53 y.o. male with history of Hodgkin's lymphoma-currently in remission, COPD, tobacco use-who presented as a transfer from [facility] for a positive stress test which was performed after patient presented with chest pain.  Subjective: No chest pain or shortness of breath.  Assessment/Plan: Chest pain secondary to unstable angina agreed to multivessel CAD including LAD disease: LHC performed on 9/18-subsequently cardiothoracic surgery consulted.  Tentatively plans for CABG on Tuesday.  Continue heparin, aspirin and statin.   History of Hodgkin's lymphoma: In remission-patient to resume follow-up with oncology in the outpatient setting  COPD: No sign of exacerbation-continue supportive care  Tobacco use: Counseled  Diet: Diet Order            Diet Heart Room service appropriate? Yes; Fluid consistency: Thin  Diet effective now               DVT Prophylaxis: Full dose anticoagulation with Heparin  Code Status: Full code  Family Communication: None at bedside  Disposition Plan: Remain inpatient  Barriers to discharge: Needs CABG next week  Antimicrobial agents: Anti-infectives (From admission, onward)   None      Procedures: none  CONSULTS:  cardiology  Time spent: 25 minutes-Greater than 50% of this time was spent in counseling, explanation of diagnosis, planning of further management, and coordination of care.  MEDICATIONS: Scheduled Meds:  aspirin  81 mg Oral Daily   atorvastatin  80 mg Oral q1800   isosorbide mononitrate  30 mg Oral Daily   nicotine  21 mg Transdermal Daily   sodium chloride flush  3 mL Intravenous Q12H   Continuous Infusions:  sodium chloride Stopped (09/12/19 1950)     sodium chloride 250 mL (09/12/19 1950)   heparin 1,100 Units/hr (09/13/19 0641)   PRN Meds:.sodium chloride, acetaminophen, acetaminophen, diazepam, ondansetron (ZOFRAN) IV, ondansetron (ZOFRAN) IV, sodium chloride flush   PHYSICAL EXAM: Vital signs: Vitals:   09/12/19 2038 09/12/19 2244 09/13/19 0514 09/13/19 0530  BP: (!) 96/58 103/65  (!) 94/57  Pulse: 72 61  (!) 50  Resp:    20  Temp:    97.9 F (36.6 C)  TempSrc:    Oral  SpO2: 96%   96%  Weight:   60.6 kg   Height:       Filed Weights   09/11/19 2000 09/12/19 0300 09/13/19 0514  Weight: 61 kg 60.6 kg 60.6 kg   Body mass index is 18.12 kg/m.   Gen Exam:Alert awake-not in any distress HEENT:atraumatic, normocephalic Chest: B/L clear to auscultation anteriorly CVS:S1S2 regular Abdomen:soft non tender, non distended Extremities:no edema Neurology: Non focal Skin: no rash  I have personally reviewed following labs and imaging studies  LABORATORY DATA: CBC: Recent Labs  Lab 09/12/19 0232 09/13/19 0441  WBC 9.8 12.7*  HGB 13.9 13.7  HCT 40.4 39.2  MCV 96.4 96.3  PLT 162 [id number]    Basic Metabolic Panel: Recent Labs  Lab 09/12/19 0246 09/13/19 0441  NA 140 138  K 3.7 3.8  CL 103 105  CO2 25 23  GLUCOSE 75 81  BUN 9 13  CREATININE 0.93 1.04  CALCIUM 9.4  9.2    GFR: Estimated Creatinine Clearance: 68.8 mL/min (by C-G formula based on SCr of 1.04 mg/dL).  Liver Function Tests: No results for input(s): AST, ALT, ALKPHOS, BILITOT, PROT, ALBUMIN in the last 168 hours. No results for input(s): LIPASE, AMYLASE in the last 168 hours. No results for input(s): AMMONIA in the last 168 hours.  Coagulation Profile: No results for input(s): INR, PROTIME in the last 168 hours.  Cardiac Enzymes: No results for input(s): CKTOTAL, CKMB, CKMBINDEX, TROPONINI in the last 168 hours.  BNP (last 3 results) No results for input(s): PROBNP in the last 8760 hours.  HbA1C: No results for input(s): HGBA1C in the  last 72 hours.  CBG: No results for input(s): GLUCAP in the last 168 hours.  Lipid Profile: Recent Labs    09/12/19 0246  CHOL 191  HDL 51  LDLCALC 129*  TRIG 55  CHOLHDL 3.7    Thyroid Function Tests: No results for input(s): TSH, T4TOTAL, FREET4, T3FREE, THYROIDAB in the last 72 hours.  Anemia Panel: No results for input(s): VITAMINB12, FOLATE, FERRITIN, TIBC, IRON, RETICCTPCT in the last 72 hours.  Urine analysis: No results found for: COLORURINE, APPEARANCEUR, LABSPEC, PHURINE, GLUCOSEU, HGBUR, BILIRUBINUR, KETONESUR, PROTEINUR, UROBILINOGEN, NITRITE, LEUKOCYTESUR  Sepsis Labs: Lactic Acid, Venous No results found for: LATICACIDVEN  MICROBIOLOGY: Recent Results (from the past 240 hour(s))  SARS Coronavirus 2 [facility] order, Performed in [facility] hospital lab) Nasopharyngeal Nasopharyngeal Swab     Status: None   Collection Time: 09/12/19  5:12 AM   Specimen: Nasopharyngeal Swab  Result Value Ref Range Status   SARS Coronavirus 2 NEGATIVE NEGATIVE Final    Comment: (NOTE) If result is NEGATIVE SARS-CoV-2 target nucleic acids are NOT DETECTED. The SARS-CoV-2 RNA is generally detectable in upper and lower  respiratory specimens during the acute phase of infection. The lowest  concentration of SARS-CoV-2 viral copies this assay can detect is 250  copies / mL. A negative result does not preclude SARS-CoV-2 infection  and should not be used as the sole basis for treatment or other  patient management decisions.  A negative result may occur with  improper specimen collection / handling, submission of specimen other  than nasopharyngeal swab, presence of viral mutation(s) within the  areas targeted by this assay, and inadequate number of viral copies  (<250 copies / mL). A negative result must be combined with clinical  observations, patient history, and epidemiological information. If result is POSITIVE SARS-CoV-2 target nucleic acids are DETECTED. The SARS-CoV-2  RNA is generally detectable in upper and lower  respiratory specimens dur ing the acute phase of infection.  Positive  results are indicative of active infection with SARS-CoV-2.  Clinical  correlation with patient history and other diagnostic information is  necessary to determine patient infection status.  Positive results do  not rule out bacterial infection or co-infection with other viruses. If result is PRESUMPTIVE POSTIVE SARS-CoV-2 nucleic acids MAY BE PRESENT.   A presumptive positive result was obtained on the submitted specimen  and confirmed on repeat testing.  While 2019 novel coronavirus  (SARS-CoV-2) nucleic acids may be present in the submitted sample  additional confirmatory testing may be necessary for epidemiological  and / or clinical management purposes  to differentiate between  SARS-CoV-2 and other Sarbecovirus currently known to infect humans.  If clinically indicated additional testing with an alternate test  methodology [phone]) is advised. The SARS-CoV-2 RNA is generally  detectable in upper and lower respiratory sp ecimens during the acute  phase of infection. The expected result is Negative. Fact Sheet for Patients:  StrictlyIdeas.no Fact Sheet for Healthcare Providers: BankingDealers.co.za This test is not yet approved or cleared by the [place] FDA and has been authorized for detection and/or diagnosis of SARS-CoV-2 by FDA under an Emergency Use Authorization (EUA).  This EUA will remain in effect (meaning this test can be used) for the duration of the COVID-19 declaration under Section 564(b)(1) of the Act, 21 U.S.C. section 360bbb-3(b)(1), unless the authorization is terminated or revoked sooner. Performed at [facility], [place] [address] [place], [place] [address]     RADIOLOGY STUDIES/RESULTS: Ct Chest Wo Contrast  Result Date: 09/13/2019 CLINICAL DATA:  Aortic disease. Preop CABG.  History of chest radiation. EXAM: CT CHEST WITHOUT CONTRAST TECHNIQUE: Multidetector CT imaging of the chest was performed following the standard protocol without IV contrast. COMPARISON:  None. FINDINGS: Cardiovascular: The heart size is normal. There is no significant pericardial effusion. Coronary artery calcifications are noted. Mild aortic calcifications are noted. There is no evidence for a thoracic aortic aneurysm. Mediastinum/Nodes: --No mediastinal or hilar lymphadenopathy. --No axillary lymphadenopathy. --No supraclavicular lymphadenopathy. --Normal thyroid gland. --The esophagus is unremarkable Lungs/Pleura: There are paraseptal emphysematous changes bilaterally. Apical bulla are noted. There is a 7 mm pulmonary nodule in the left upper lobe (axial series 8, image 23). There is no pneumothorax. There is no significant pleural effusion. There is a trace amount of debris within the lower trachea. Upper Abdomen: There is a branching hyperdense area within the partially visualized upper pole of the right kidney. This may represent contrast from a recent cardiac catheterization. Musculoskeletal: No chest wall abnormality. No acute or significant osseous findings. Review of the MIP images confirms the above findings. IMPRESSION: 1. No acute intrathoracic abnormality. 2. There is a 7 mm pulmonary nodule in the left upper lobe. Non-contrast chest CT at 6-12 months is recommended. If the nodule is stable at time of repeat CT, then future CT at 18-24 months (from today's scan) is considered optional for low-risk patients, but is recommended for high-risk patients. This recommendation follows the consensus statement: Guidelines for Management of Incidental Pulmonary Nodules Detected on CT Images: From the Fleischner Society 2017; Radiology 2017; 284:228-243. 3. Mild asymmetric collecting system dilatation on the right with evidence for retained contrast from the prior cardiac catheterization. As the right kidney is  only partially visualized on this exam, follow-up with a nonemergent renal ultrasound is recommended for further evaluation of this finding. Aortic Atherosclerosis (ICD10-I70.0) and Emphysema (ICD10-J43.9). Electronically Signed   By: Constance Holster M.D.   On: 09/13/2019 00:17     LOS: 2 days   Oren Binet, MD  [facility]  If 7PM-7AM, please contact night-coverage  Please page via [email or web address]  Go to [email or web address] and use [facility]'s universal password to access. If you do not have the password, please contact the hospital operator.  Locate the [facility] provider you are looking for under [facility] and page to a number that you can be directly reached. If you still have difficulty reaching the provider, please page the [facility] (Director on Call) for the Hospitalists listed on amion for assistance.  09/13/2019, 11:50 AM

## 2019-09-13 NOTE — Progress Notes (Signed)
[place] for heparin Indication: chest pain/ACS  Allergies  Allergen Reactions  . Ketorolac Hives  . Tramadol Hives    Patient Measurements: Height: 6' (182.9 cm) Weight: 133 lb 9.6 oz (60.6 kg) IBW/kg (Calculated) : 77.6 Heparin Dosing Weight: 61.5kg  Vital Signs: Temp: 98.7 F (37.1 C) (09/19 2026) Temp Source: Oral (09/19 2026) BP: 112/64 (09/19 2026) Pulse Rate: 62 (09/19 2026)  Labs: Recent Labs    09/11/19 2148 09/12/19 0013  09/12/19 0232 09/12/19 0246 09/13/19 0441 09/13/19 1220 09/13/19 1946  HGB  --   --   --  13.9  --  13.7  --   --   HCT  --   --   --  40.4  --  39.2  --   --   PLT  --   --   --  162  --  174  --   --   HEPARINUNFRC  --   --    < > 0.28*  --  0.18* 0.26* 0.23*  CREATININE  --   --   --   --  0.93 1.04  --   --   TROPONINIHS 5 5  --   --   --   --   --   --    < > = values in this interval not displayed.    Estimated Creatinine Clearance: 68.8 mL/min (by C-G formula based on SCr of 1.04 mg/dL).   Assessment: 14 yoM transferred from OSH with CP and concern for ACS after high risk stress test. s/p cath - 3V CAD plan CABG for Tuesday (9/22). Heparin restarted post sheath removal on 09/12/19.  Heparin level is sub-therapeutic and trended down.  No issue with infusion nor bleeding per RN.  Goal of Therapy:  Heparin level 0.3-0.7 units/ml Monitor platelets by anticoagulation protocol: Yes   Plan:  Increase heparin gtt to 1350 units/hr F/U AM labs  Maleka Contino D. Mina Marble, PharmD, BCPS, [place] 09/13/2019, 8:41 PM

## 2019-09-13 NOTE — Progress Notes (Signed)
[place] for heparin Indication: chest pain/ACS  Allergies  Allergen Reactions  . Ketorolac Hives  . Tramadol Hives    Patient Measurements: Height: 6' (182.9 cm) Weight: 133 lb 9.6 oz (60.6 kg) IBW/kg (Calculated) : 77.6 Heparin Dosing Weight: 61.5kg  Vital Signs: Temp: 98.3 F (36.8 C) (09/19 1325) Temp Source: Oral (09/19 1325) BP: 103/64 (09/19 1325) Pulse Rate: 62 (09/19 1325)  Labs: Recent Labs    09/11/19 2148 09/12/19 0013 09/12/19 0232 09/12/19 0246 09/13/19 0441 09/13/19 1220  HGB  --   --  13.9  --  13.7  --   HCT  --   --  40.4  --  39.2  --   PLT  --   --  162  --  174  --   HEPARINUNFRC  --   --  0.28*  --  0.18* 0.26*  CREATININE  --   --   --  0.93 1.04  --   TROPONINIHS 5 5  --   --   --   --     Estimated Creatinine Clearance: 68.8 mL/min (by C-G formula based on SCr of 1.04 mg/dL).   Assessment: 61 yoM transferred from OSH with CP and concern for ACS after high risk stress test. s/p cath - 3V CAD plan CABG for Tuesday (9/22). Heparin restarted post sheath removal last night.  Heparin level subtherapeutic (0.26) after increasing drip rate to 1100 units/hr. CBC stable. No bleeding noted. Changed IV site for heparin but did not stop heparin per RN. Will increase dose.  Goal of Therapy:  Heparin level 0.3-0.7 units/ml Monitor platelets by anticoagulation protocol: Yes   Plan:  Increase heparin gtt to 1200 units/hr F/u 6 hr heparin level Daily HL and CBC Monitor bleeding  Richardine Service, PharmD PGY1 Pharmacy Resident Phone: [phone] 09/13/2019  1:36 PM  Please check [email or web address] for unit-specific pharmacy phone numbers.

## 2019-09-14 ENCOUNTER — Other Ambulatory Visit: Payer: Self-pay

## 2019-09-14 DIAGNOSIS — I25110 Atherosclerotic heart disease of native coronary artery with unstable angina pectoris: Secondary | ICD-10-CM

## 2019-09-14 LAB — CBC
HCT: 38.7 % — ABNORMAL LOW (ref 39.0–52.0)
Hemoglobin: 13.1 g/dL (ref 13.0–17.0)
MCH: 32.5 pg (ref 26.0–34.0)
MCHC: 33.9 g/dL (ref 30.0–36.0)
MCV: 96 fL (ref 80.0–100.0)
Platelets: 161 10*3/uL (ref 150–400)
RBC: 4.03 MIL/uL — ABNORMAL LOW (ref 4.22–5.81)
RDW: 12.7 % (ref 11.5–15.5)
WBC: 10.5 10*3/uL (ref 4.0–10.5)
nRBC: 0 % (ref 0.0–0.2)

## 2019-09-14 LAB — HEPARIN LEVEL (UNFRACTIONATED)
Heparin Unfractionated: 0.75 IU/mL — ABNORMAL HIGH (ref 0.30–0.70)
Heparin Unfractionated: 0.8 IU/mL — ABNORMAL HIGH (ref 0.30–0.70)
Heparin Unfractionated: 0.74 IU/mL — ABNORMAL HIGH (ref 0.30–0.70)

## 2019-09-14 MED ORDER — INFLUENZA VAC SPLIT QUAD 0.5 ML IM SUSY
0.5000 mL | PREFILLED_SYRINGE | INTRAMUSCULAR | Status: AC
  Administered 2019-09-15: 0.5 mL via INTRAMUSCULAR
  Filled 2019-09-14: qty 0.5

## 2019-09-14 NOTE — Progress Notes (Signed)
Progress Note  Patient Name: Russell Harris Date of Encounter: 09/14/2019  Primary Cardiologist: Tia Alert (new)  Subjective   No chest pain or shortness of breath.  States that he slept well.  Inpatient Medications    Scheduled Meds:  aspirin  81 mg Oral Daily   atorvastatin  80 mg Oral q1800   isosorbide mononitrate  30 mg Oral Daily   nicotine  21 mg Transdermal Daily   sodium chloride flush  3 mL Intravenous Q12H   Continuous Infusions:  sodium chloride Stopped (09/12/19 1950)   sodium chloride 250 mL (09/13/19 2346)   heparin 1,250 Units/hr (09/14/19 0525)   PRN Meds: sodium chloride, acetaminophen, acetaminophen, diazepam, ondansetron (ZOFRAN) IV, ondansetron (ZOFRAN) IV, sodium chloride flush   Vital Signs    Vitals:   09/13/19 0530 09/13/19 1325 09/13/19 2026 09/14/19 0620  BP: (!) 94/57 103/64 112/64 (!) 108/51  Pulse: (!) 50 62 62 (!) 53  Resp: 20  18 16   Temp: 97.9 F (36.6 C) 98.3 F (36.8 C) 98.7 F (37.1 C) 97.7 F (36.5 C)  TempSrc: Oral Oral Oral Oral  SpO2: 96% 97% 98% 98%  Weight:    60.9 kg  Height:        Intake/Output Summary (Last 24 hours) at 09/14/2019 0830 Last data filed at 09/14/2019 0600 Gross per 24 hour  Intake 1424.86 ml  Output 850 ml  Net 574.86 ml   Filed Weights   09/12/19 0300 09/13/19 0514 09/14/19 0620  Weight: 60.6 kg 60.6 kg 60.9 kg    Telemetry    Sinus rhythm/sinus bradycardia.  Personally reviewed.  ECG    An ECG dated 09/12/2019 was personally reviewed today and demonstrated:  Sinus rhythm with IVCD, left anterior fascicular block, possible old anterior infarct pattern.  Physical Exam   GEN: No acute distress.   Neck: No JVD. Cardiac: RRR, no gallop.  Respiratory: Nonlabored. Clear to auscultation bilaterally. GI: Soft, nontender, bowel sounds present. MS: No edema; No deformity. Neuro:  Nonfocal. Psych: Alert and oriented x 3. Normal affect.  Labs    Chemistry Recent Labs  Lab  09/12/19 0246 09/13/19 0441  NA 140 138  K 3.7 3.8  CL 103 105  CO2 25 23  GLUCOSE 75 81  BUN 9 13  CREATININE 0.93 1.04  CALCIUM 9.4 9.2  GFRNONAA >60 >60  GFRAA >60 >60  ANIONGAP 12 10     Hematology Recent Labs  Lab 09/12/19 0232 09/13/19 0441 09/14/19 0355  WBC 9.8 12.7* 10.5  RBC 4.19* 4.07* 4.03*  HGB 13.9 13.7 13.1  HCT 40.4 39.2 38.7*  MCV 96.4 96.3 96.0  MCH 33.2 33.7 32.5  MCHC 34.4 34.9 33.9  RDW 12.9 12.8 12.7  PLT 162 174 161    Cardiac Enzymes Recent Labs  Lab 09/11/19 2148 09/12/19 0013  TROPONINIHS 5 5    Radiology    Ct Chest Wo Contrast  Result Date: 09/13/2019 CLINICAL DATA:  Aortic disease. Preop CABG. History of chest radiation. EXAM: CT CHEST WITHOUT CONTRAST TECHNIQUE: Multidetector CT imaging of the chest was performed following the standard protocol without IV contrast. COMPARISON:  None. FINDINGS: Cardiovascular: The heart size is normal. There is no significant pericardial effusion. Coronary artery calcifications are noted. Mild aortic calcifications are noted. There is no evidence for a thoracic aortic aneurysm. Mediastinum/Nodes: --No mediastinal or hilar lymphadenopathy. --No axillary lymphadenopathy. --No supraclavicular lymphadenopathy. --Normal thyroid gland. --The esophagus is unremarkable Lungs/Pleura: There are paraseptal emphysematous changes bilaterally. Apical bulla are  noted. There is a 7 mm pulmonary nodule in the left upper lobe (axial series 8, image 23). There is no pneumothorax. There is no significant pleural effusion. There is a trace amount of debris within the lower trachea. Upper Abdomen: There is a branching hyperdense area within the partially visualized upper pole of the right kidney. This may represent contrast from a recent cardiac catheterization. Musculoskeletal: No chest wall abnormality. No acute or significant osseous findings. Review of the MIP images confirms the above findings. IMPRESSION: 1. No acute  intrathoracic abnormality. 2. There is a 7 mm pulmonary nodule in the left upper lobe. Non-contrast chest CT at 6-12 months is recommended. If the nodule is stable at time of repeat CT, then future CT at 18-24 months (from today's scan) is considered optional for low-risk patients, but is recommended for high-risk patients. This recommendation follows the consensus statement: Guidelines for Management of Incidental Pulmonary Nodules Detected on CT Images: From the Fleischner Society 2017; Radiology 2017; 284:228-243. 3. Mild asymmetric collecting system dilatation on the right with evidence for retained contrast from the prior cardiac catheterization. As the right kidney is only partially visualized on this exam, follow-up with a nonemergent renal ultrasound is recommended for further evaluation of this finding. Aortic Atherosclerosis (ICD10-I70.0) and Emphysema (ICD10-J43.9). Electronically Signed   By: Constance Holster M.D.   On: 09/13/2019 00:17   Vas US Doppler Pre Cabg  Result Date: 09/13/2019 PREOPERATIVE VASCULAR EVALUATION  Risk Factors:     Hypertension, current smoker, coronary artery disease. Other Factors:    History of Hodgkin's Lymphoma. Limitations:      High bifurcation and facial hair Comparison Study: No prior study on file Performing Technologist: Sharion Dove RVS  Examination Guidelines: A complete evaluation includes B-mode imaging, spectral Doppler, color Doppler, and power Doppler as needed of all accessible portions of each vessel. Bilateral testing is considered an integral part of a complete examination. Limited examinations for reoccurring indications may be performed as noted.  Right Carotid Findings: +----------+--------+--------+--------+--------+------------------+             PSV cm/s EDV cm/s Stenosis Describe Comments            +----------+--------+--------+--------+--------+------------------+  CCA Prox   140      17                         intimal thickening   +----------+--------+--------+--------+--------+------------------+  CCA Distal 116      18                         intimal thickening  +----------+--------+--------+--------+--------+------------------+  ICA Prox   67       26                                             +----------+--------+--------+--------+--------+------------------+  ICA Distal 82       31                                             +----------+--------+--------+--------+--------+------------------+  ECA        76       17                                             +----------+--------+--------+--------+--------+------------------+  Portions of this table do not appear on this page. +----------+--------+-------+--------+------------+             PSV cm/s EDV cms Describe Arm Pressure  +----------+--------+-------+--------+------------+  Subclavian 145                       100           +----------+--------+-------+--------+------------+ +---------+--------+--+--------+--+  Vertebral PSV cm/s 49 EDV cm/s 14  +---------+--------+--+--------+--+ Left Carotid Findings: +----------+--------+--------+--------+--------+------------------+             PSV cm/s EDV cm/s Stenosis Describe Comments            +----------+--------+--------+--------+--------+------------------+  CCA Prox   116      27                         intimal thickening  +----------+--------+--------+--------+--------+------------------+  CCA Distal 90       20                         intimal thickening  +----------+--------+--------+--------+--------+------------------+  ICA Prox   73       25                                             +----------+--------+--------+--------+--------+------------------+  ICA Distal 71       22                                             +----------+--------+--------+--------+--------+------------------+  ECA        96       11                                             +----------+--------+--------+--------+--------+------------------+  +----------+--------+--------+--------+------------+  Subclavian PSV cm/s EDV cm/s Describe Arm Pressure  +----------+--------+--------+--------+------------+             170                        90            +----------+--------+--------+--------+------------+ +---------+--------+--+--------+--+  Vertebral PSV cm/s 61 EDV cm/s 18  +---------+--------+--+--------+--+  ABI Findings: +--------+------------------+-----+---------+--------+  Right    Rt Pressure (mmHg) Index Waveform  Comment   +--------+------------------+-----+---------+--------+  Brachial 100                      triphasic           +--------+------------------+-----+---------+--------+  PTA                               triphasic           +--------+------------------+-----+---------+--------+  DP                                triphasic           +--------+------------------+-----+---------+--------+ +--------+------------------+-----+---------+-------+  Left     Lt Pressure (mmHg) Index Waveform  Comment  +--------+------------------+-----+---------+-------+  Brachial 90                       triphasic          +--------+------------------+-----+---------+-------+  PTA                               triphasic          +--------+------------------+-----+---------+-------+  DP                                triphasic          +--------+------------------+-----+---------+-------+  Right Doppler Findings: +-----------+--------+-----+---------+-----------------------------------------+  Site        Pressure Index Doppler   Comments                                   +-----------+--------+-----+---------+-----------------------------------------+  Brachial    100            triphasic                                            +-----------+--------+-----+---------+-----------------------------------------+  Radial                     triphasic                                             +-----------+--------+-----+---------+-----------------------------------------+  Ulnar                      triphasic                                            +-----------+--------+-----+---------+-----------------------------------------+  Palmar Arch                          Doppler signal remains normal with radial                                        compression and nearly obliterates with                                          ulnar compression                          +-----------+--------+-----+---------+-----------------------------------------+  Left Doppler Findings: +-----------+--------+-----+---------+-----------------------------------------+  Site        Pressure Index Doppler   Comments                                   +-----------+--------+-----+---------+-----------------------------------------+  Brachial    90             triphasic                                            +-----------+--------+-----+---------+-----------------------------------------+  Radial                     triphasic                                            +-----------+--------+-----+---------+-----------------------------------------+  Ulnar                      triphasic                                            +-----------+--------+-----+---------+-----------------------------------------+  Palmar Arch                          Doppler signal remains normal with radial                                        compression and nearly obliterates with                                          ulnar compression                          +-----------+--------+-----+---------+-----------------------------------------+  Summary: Right Carotid: The extracranial vessels were near-normal with only minimal wall                thickening or plaque. Left Carotid: The extracranial vessels were near-normal with only minimal wall               thickening or plaque. Vertebrals:  Bilateral vertebral arteries demonstrate antegrade  flow. Subclavians: Normal flow hemodynamics were seen in bilateral subclavian              arteries. Right Upper Extremity: Doppler waveforms remain within normal limits with right radial compression. Doppler waveforms nearly obliterate with right ulnar compression. Left Upper Extremity: Doppler waveforms remain within normal limits with left radial compression. Doppler waveforms nearly obliterate with left ulnar compression.    Preliminary     Cardiac Studies   Echocardiogram 09/12/2019: 1. Left ventricular ejection fraction, by visual estimation, is 50 to 55%. The left ventricle has mildly decreased function. Normal left ventricular size. There is no left ventricular hypertrophy. False tendon in the left ventricle. 2. Basal and mid inferolateral wall is abnormal. 3. Elevated left ventricular end-diastolic pressure. 4. Left ventricular diastolic Doppler parameters are consistent with impaired relaxation pattern of LV diastolic filling. 5. Global right ventricle has mildly reduced systolic function.The right ventricular size is mildly enlarged. No increase in right ventricular wall thickness. 6. Left atrial size was normal. 7. Right atrial size was normal. 8. The mitral valve is normal in structure. No evidence of mitral valve regurgitation. No evidence of mitral stenosis. 9. The tricuspid valve is normal in structure. Tricuspid valve regurgitation was not visualized by color flow Doppler. 10. The aortic valve is normal in structure. Aortic valve regurgitation was not visualized by color flow Doppler. Structurally normal aortic valve, with no evidence of sclerosis or stenosis. 11. The pulmonic valve was normal in  structure. Pulmonic valve regurgitation is not visualized by color flow Doppler. 12. The inferior vena cava is normal in size with greater than 50% respiratory variability, suggesting right atrial pressure of 3 mmHg.  Cardiac catheterization 09/12/2019:  Mid RCA lesion is 20%  stenosed.  Prox RCA lesion is 20% stenosed.  Ost Cx to Prox Cx lesion is 50% stenosed.  Prox Cx lesion is 50% stenosed.  1st Mrg lesion is 50% stenosed.  1st Diag lesion is 90% stenosed.  Prox LAD to Mid LAD lesion is 70% stenosed.  Mid LAD to Dist LAD lesion is 99% stenosed with 99% stenosed side branch in 2nd Diag.  Multivessel CAD with moderate coronary calcification and evidence for diffuse chronic appearing long subtotal LAD stenosis with 90% ostial narrowing in the proximal diagonal vessel and subtotal second diagonal stenosis. There are collaterals septal perforating arteries from the day supplying the mid distal vessel as well as collateralization from the circumflex marginal branch apically.  The circumflex vessel appears to have an ulcerated proximal plaque with residual stenosis of 50% followed by 50% bifurcation stenosis of the circumflex at the takeoff of the OM vessel and ostially involving the OM vessel.  The RCA is a dominant vessel with mild luminal irregularity and mid stenoses of 20%. There is significant septal collateralization to the mid distal LAD.  Low normal global LV contractility with suggestion of possible distal anterolateral hypocontractility. EF estimate at 50 to 55%. LVEDP 9 mm.  RECOMMENDATION: The LAD lesion does not appear acute and most likely is chronic. With the extensive calcified LAD and diagonal disease as well as probable ostial ulcerated plaque in the circumflex with bifurcation circumflex/OM stenoses consider surgical consultation for CABG revascularization surgery. Initiate high potency statin therapy. Will initiate beta-blocker therapy as well as low-dose nitrates.  Patient Profile     53 y.o. male with a history of tobacco abuse, Hodgkin's lymphoma diagnosed in 1997 s/p6 months of chemotherapy,RBBBand COPD now presenting after abnormal Myoview and diagnostic cardiac catheterization demonstrating multivessel CAD.  Assessment  & Plan    1.  Multivessel CAD, most significant within the LAD distribution and chronic appearing with possible ulcerated proximal circumflex plaque and moderate stenosis in that distribution as well.  LVEF 50 to 55%.  Patient is planned for CABG on Tuesday.  2.  COPD with history of tobacco abuse.  Chest CT shows paraseptal emphysematous changes bilaterally and apical bulla.  3.  Mixed hyperlipidemia, LDL 129.  He is now on statin therapy.  4.  History of Hodgkin's lymphoma status post treatment in the 1990s.  He has not had regular follow-up.  5.  7 mm pulmonary nodule, left upper lobe by chest CT.  Recommendation is for 6 to 74-month follow-up imaging.  Patient is scheduled for CABG on Tuesday per [facility].  Continue aspirin, Lipitor, Imdur, and IV heparin.  Beta-blocker was discontinued in light of bradycardia and conduction system disease by ECG.  Continue telemetry monitoring.  Signed, Rozann Lesches, MD  09/14/2019, 8:30 AM

## 2019-09-14 NOTE — Progress Notes (Signed)
[place] for heparin Indication: 3V CAD  Allergies  Allergen Reactions  . Ketorolac Hives  . Tramadol Hives    Patient Measurements: Height: 6' (182.9 cm) Weight: 134 lb 4.8 oz (60.9 kg) IBW/kg (Calculated) : 77.6 Heparin Dosing Weight: 61.5kg  Vital Signs: Temp: 97.7 F (36.5 C) (09/20 0620) Temp Source: Oral (09/20 0620) BP: 108/51 (09/20 0620) Pulse Rate: 53 (09/20 0620)  Labs: Recent Labs    09/11/19 2148 09/12/19 0013  09/12/19 0232 09/12/19 0246 09/13/19 0441  09/13/19 1946 09/14/19 0355 09/14/19 1220  HGB  --   --    < > 13.9  --  13.7  --   --  13.1  --   HCT  --   --   --  40.4  --  39.2  --   --  38.7*  --   PLT  --   --   --  162  --  174  --   --  161  --   HEPARINUNFRC  --   --    < > 0.28*  --  0.18*   < > 0.23* 0.75* 0.80*  CREATININE  --   --   --   --  0.93 1.04  --   --   --   --   TROPONINIHS 5 5  --   --   --   --   --   --   --   --    < > = values in this interval not displayed.    Estimated Creatinine Clearance: 69.1 mL/min (by C-G formula based on SCr of 1.04 mg/dL).   Assessment: 17 yoM transferred from OSH with CP and concern for ACS after high risk stress test. s/p cath - 3V CAD plan CABG for Tuesday (9/22). Heparin restarted post sheath removal on 09/12/19.  Heparin level remains supratherapeutic at 0.8 despite previous rate decrease. No issues with line or bleeding, level drawn from opposite arm per RN. Hgb stable at 13.1, platelets are normal.  Goal of Therapy:  Heparin level 0.3-0.7 units/ml Monitor platelets by anticoagulation protocol: Yes   Plan:  Decrease heparin gtt to 1100 units/hr F/u 6 hr heparin level Daily heparin level, CBC Monitor for s/sx of bleeding   Thank you for involving pharmacy in this patient's care.  Renold Genta, PharmD, BCPS Clinical Pharmacist Clinical phone for 09/14/2019 until 3p is [phone] 09/14/2019 12:58 PM  **Pharmacist phone directory can be found on  [email or web address] listed under [place]**

## 2019-09-14 NOTE — Progress Notes (Signed)
[place] for heparin Indication: chest pain/ACS  Allergies  Allergen Reactions  . Ketorolac Hives  . Tramadol Hives    Patient Measurements: Height: 6' (182.9 cm) Weight: 133 lb 9.6 oz (60.6 kg) IBW/kg (Calculated) : 77.6 Heparin Dosing Weight: 61.5kg  Vital Signs: Temp: 98.7 F (37.1 C) (09/19 2026) Temp Source: Oral (09/19 2026) BP: 112/64 (09/19 2026) Pulse Rate: 62 (09/19 2026)  Labs: Recent Labs    09/11/19 2148 09/12/19 0013  09/12/19 0232 09/12/19 0246 09/13/19 0441 09/13/19 1220 09/13/19 1946 09/14/19 0355  HGB  --   --    < > 13.9  --  13.7  --   --  13.1  HCT  --   --   --  40.4  --  39.2  --   --  38.7*  PLT  --   --   --  162  --  174  --   --  161  HEPARINUNFRC  --   --    < > 0.28*  --  0.18* 0.26* 0.23* 0.75*  CREATININE  --   --   --   --  0.93 1.04  --   --   --   TROPONINIHS 5 5  --   --   --   --   --   --   --    < > = values in this interval not displayed.    Estimated Creatinine Clearance: 68.8 mL/min (by C-G formula based on SCr of 1.04 mg/dL).   Assessment: 21 yoM transferred from OSH with CP and concern for ACS after high risk stress test. s/p cath - 3V CAD plan CABG for Tuesday (9/22). Heparin restarted post sheath removal on 09/12/19.  Heparin level now slightly supratherapeutic (0.75) on gtt at 1350 units/hr. No issues with line or bleeding reported per RN.  Goal of Therapy:  Heparin level 0.3-0.7 units/ml Monitor platelets by anticoagulation protocol: Yes   Plan:  Decrease heparin gtt to 1250 units/hr F/u 6 hr heparin level  Sherlon Handing, PharmD, BCPS 09/14/2019, 5:05 AM

## 2019-09-14 NOTE — Progress Notes (Signed)
[place] for heparin Indication: 3V CAD  Allergies  Allergen Reactions  . Ketorolac Hives  . Tramadol Hives    Patient Measurements: Height: 6' (182.9 cm) Weight: 134 lb 4.8 oz (60.9 kg) IBW/kg (Calculated) : 77.6 Heparin Dosing Weight: 61.5kg  Vital Signs: Temp: 98.6 F (37 C) (09/20 2039) Temp Source: Oral (09/20 2039) BP: 103/61 (09/20 2039) Pulse Rate: 58 (09/20 2039)  Labs: Recent Labs    09/11/19 2148 09/12/19 0013  09/12/19 0232 09/12/19 0246 09/13/19 0441  09/14/19 0355 09/14/19 1220 09/14/19 2023  HGB  --   --    < > 13.9  --  13.7  --  13.1  --   --   HCT  --   --   --  40.4  --  39.2  --  38.7*  --   --   PLT  --   --   --  162  --  174  --  161  --   --   HEPARINUNFRC  --   --    < > 0.28*  --  0.18*   < > 0.75* 0.80* 0.74*  CREATININE  --   --   --   --  0.93 1.04  --   --   --   --   TROPONINIHS 5 5  --   --   --   --   --   --   --   --    < > = values in this interval not displayed.    Estimated Creatinine Clearance: 69.1 mL/min (by C-G formula based on SCr of 1.04 mg/dL).   Assessment: 41 yoM transferred from OSH with CP and concern for ACS after high risk stress test. S/p cath - 3V CAD plan CABG for Tuesday (9/22). Heparin restarted post sheath removal on 09/12/19.  Heparin level remains supra-therapeutic despite previous rate decreases.  Lab was drawn from the same arm as the infusion but distal to the IV site.  Unable to draw lab from the right arm because the radial site was used for cath.  He was bleeding from the radial site earlier this afternoon - now resolved.  Goal of Therapy:  Heparin level 0.3-0.7 units/ml Monitor platelets by anticoagulation protocol: Yes   Plan:  Decrease heparin gtt to 950 units/hr F/U AM labs  Leilanni Halvorson D. Mina Marble, PharmD, BCPS, [place] 09/14/2019, 9:02 PM

## 2019-09-14 NOTE — Progress Notes (Signed)
PROGRESS NOTE        PATIENT DETAILS Name: Russell Harris Age: 53 y.o. Sex: male Date of Birth: [birth year] Admit Date: 09/11/2019 Admitting Physician Mercy Riding, MD [id number], Sharon Mt, MD  Brief Narrative: Patient is a 53 y.o. male with history of Hodgkin's lymphoma-currently in remission, COPD, tobacco use-who presented as a transfer from [facility] for a positive stress test which was performed after patient presented with chest pain.  Subjective: No chest pain or shortness of breath.  Assessment/Plan: Chest pain secondary to unstable angina secondary to multivessel CAD including LAD disease, -subsequently cardiothoracic surgery consulted.  Tentatively plans for CABG on Tuesday.  Continue heparin, aspirin and statin.   History of Hodgkin's lymphoma: In remission-patient to resume follow-up with oncology in the outpatient setting  COPD: No sign of exacerbation-continue supportive care  Tobacco use: Counseled  Diet: Diet Order            Diet Heart Room service appropriate? Yes; Fluid consistency: Thin  Diet effective now               DVT Prophylaxis: Full dose anticoagulation with Heparin  Code Status: Full code  Family Communication: None at bedside  Disposition Plan: Remain inpatient  Barriers to discharge: Needs CABG next week  Antimicrobial agents: Anti-infectives (From admission, onward)   None      Procedures: LHC performed on 9/18  CONSULTS:  cardiology  Time spent: 15 minutes-Greater than 50% of this time was spent in counseling, explanation of diagnosis, planning of further management, and coordination of care.  MEDICATIONS: Scheduled Meds:  aspirin  81 mg Oral Daily   atorvastatin  80 mg Oral q1800   [START ON 09/15/2019] influenza vac split quadrivalent PF  0.5 mL Intramuscular Tomorrow-1000   isosorbide mononitrate  30 mg Oral Daily   nicotine  21 mg Transdermal Daily   sodium chloride  flush  3 mL Intravenous Q12H   Continuous Infusions:  sodium chloride Stopped (09/12/19 1950)   sodium chloride 250 mL (09/13/19 2346)   heparin 1,250 Units/hr (09/14/19 0525)   PRN Meds:.sodium chloride, acetaminophen, acetaminophen, diazepam, ondansetron (ZOFRAN) IV, ondansetron (ZOFRAN) IV, sodium chloride flush   PHYSICAL EXAM: Vital signs: Vitals:   09/13/19 0530 09/13/19 1325 09/13/19 2026 09/14/19 0620  BP: (!) 94/57 103/64 112/64 (!) 108/51  Pulse: (!) 50 62 62 (!) 53  Resp: 20  18 16   Temp: 97.9 F (36.6 C) 98.3 F (36.8 C) 98.7 F (37.1 C) 97.7 F (36.5 C)  TempSrc: Oral Oral Oral Oral  SpO2: 96% 97% 98% 98%  Weight:    60.9 kg  Height:       Filed Weights   09/12/19 0300 09/13/19 0514 09/14/19 0620  Weight: 60.6 kg 60.6 kg 60.9 kg   Body mass index is 18.21 kg/m.   Gen Exam:Alert awake-not in any distress HEENT:atraumatic, normocephalic Chest: B/L clear to auscultation anteriorly CVS:S1S2 regular Abdomen:soft non tender, non distended Extremities:no edema Neurology: Non focal Skin: no rash  I have personally reviewed following labs and imaging studies  LABORATORY DATA: CBC: Recent Labs  Lab 09/12/19 0232 09/13/19 0441 09/14/19 0355  WBC 9.8 12.7* 10.5  HGB 13.9 13.7 13.1  HCT 40.4 39.2 38.7*  MCV 96.4 96.3 96.0  PLT 162 174 [id number]    Basic Metabolic Panel: Recent Labs  Lab 09/12/19 0246 09/13/19 0441  NA 140 138  K 3.7 3.8  CL 103 105  CO2 25 23  GLUCOSE 75 81  BUN 9 13  CREATININE 0.93 1.04  CALCIUM 9.4 9.2    GFR: Estimated Creatinine Clearance: 69.1 mL/min (by C-G formula based on SCr of 1.04 mg/dL).  Liver Function Tests: No results for input(s): AST, ALT, ALKPHOS, BILITOT, PROT, ALBUMIN in the last 168 hours. No results for input(s): LIPASE, AMYLASE in the last 168 hours. No results for input(s): AMMONIA in the last 168 hours.  Coagulation Profile: No results for input(s): INR, PROTIME in the last 168 hours.  Cardiac  Enzymes: No results for input(s): CKTOTAL, CKMB, CKMBINDEX, TROPONINI in the last 168 hours.  BNP (last 3 results) No results for input(s): PROBNP in the last 8760 hours.  HbA1C: No results for input(s): HGBA1C in the last 72 hours.  CBG: No results for input(s): GLUCAP in the last 168 hours.  Lipid Profile: Recent Labs    09/12/19 0246  CHOL 191  HDL 51  LDLCALC 129*  TRIG 55  CHOLHDL 3.7    Thyroid Function Tests: No results for input(s): TSH, T4TOTAL, FREET4, T3FREE, THYROIDAB in the last 72 hours.  Anemia Panel: No results for input(s): VITAMINB12, FOLATE, FERRITIN, TIBC, IRON, RETICCTPCT in the last 72 hours.  Urine analysis: No results found for: COLORURINE, APPEARANCEUR, LABSPEC, PHURINE, GLUCOSEU, HGBUR, BILIRUBINUR, KETONESUR, PROTEINUR, UROBILINOGEN, NITRITE, LEUKOCYTESUR  Sepsis Labs: Lactic Acid, Venous No results found for: LATICACIDVEN  MICROBIOLOGY: Recent Results (from the past 240 hour(s))  SARS Coronavirus 2 [facility] order, Performed in [facility] hospital lab) Nasopharyngeal Nasopharyngeal Swab     Status: None   Collection Time: 09/12/19  5:12 AM   Specimen: Nasopharyngeal Swab  Result Value Ref Range Status   SARS Coronavirus 2 NEGATIVE NEGATIVE Final    Comment: (NOTE) If result is NEGATIVE SARS-CoV-2 target nucleic acids are NOT DETECTED. The SARS-CoV-2 RNA is generally detectable in upper and lower  respiratory specimens during the acute phase of infection. The lowest  concentration of SARS-CoV-2 viral copies this assay can detect is 250  copies / mL. A negative result does not preclude SARS-CoV-2 infection  and should not be used as the sole basis for treatment or other  patient management decisions.  A negative result may occur with  improper specimen collection / handling, submission of specimen other  than nasopharyngeal swab, presence of viral mutation(s) within the  areas targeted by this assay, and inadequate number of viral  copies  (<250 copies / mL). A negative result must be combined with clinical  observations, patient history, and epidemiological information. If result is POSITIVE SARS-CoV-2 target nucleic acids are DETECTED. The SARS-CoV-2 RNA is generally detectable in upper and lower  respiratory specimens dur ing the acute phase of infection.  Positive  results are indicative of active infection with SARS-CoV-2.  Clinical  correlation with patient history and other diagnostic information is  necessary to determine patient infection status.  Positive results do  not rule out bacterial infection or co-infection with other viruses. If result is PRESUMPTIVE POSTIVE SARS-CoV-2 nucleic acids MAY BE PRESENT.   A presumptive positive result was obtained on the submitted specimen  and confirmed on repeat testing.  While 2019 novel coronavirus  (SARS-CoV-2) nucleic acids may be present in the submitted sample  additional confirmatory testing may be necessary for epidemiological  and / or clinical management purposes  to differentiate between  SARS-CoV-2 and other Sarbecovirus currently known to infect humans.  If  clinically indicated additional testing with an alternate test  methodology [phone]) is advised. The SARS-CoV-2 RNA is generally  detectable in upper and lower respiratory sp ecimens during the acute  phase of infection. The expected result is Negative. Fact Sheet for Patients:  StrictlyIdeas.no Fact Sheet for Healthcare Providers: BankingDealers.co.za This test is not yet approved or cleared by the [place] FDA and has been authorized for detection and/or diagnosis of SARS-CoV-2 by FDA under an Emergency Use Authorization (EUA).  This EUA will remain in effect (meaning this test can be used) for the duration of the COVID-19 declaration under Section 564(b)(1) of the Act, 21 U.S.C. section 360bbb-3(b)(1), unless the authorization is terminated  or revoked sooner. Performed at [facility], [place] [address] [place], [place] [address]     RADIOLOGY STUDIES/RESULTS: Ct Chest Wo Contrast  Result Date: 09/13/2019 CLINICAL DATA:  Aortic disease. Preop CABG. History of chest radiation. EXAM: CT CHEST WITHOUT CONTRAST TECHNIQUE: Multidetector CT imaging of the chest was performed following the standard protocol without IV contrast. COMPARISON:  None. FINDINGS: Cardiovascular: The heart size is normal. There is no significant pericardial effusion. Coronary artery calcifications are noted. Mild aortic calcifications are noted. There is no evidence for a thoracic aortic aneurysm. Mediastinum/Nodes: --No mediastinal or hilar lymphadenopathy. --No axillary lymphadenopathy. --No supraclavicular lymphadenopathy. --Normal thyroid gland. --The esophagus is unremarkable Lungs/Pleura: There are paraseptal emphysematous changes bilaterally. Apical bulla are noted. There is a 7 mm pulmonary nodule in the left upper lobe (axial series 8, image 23). There is no pneumothorax. There is no significant pleural effusion. There is a trace amount of debris within the lower trachea. Upper Abdomen: There is a branching hyperdense area within the partially visualized upper pole of the right kidney. This may represent contrast from a recent cardiac catheterization. Musculoskeletal: No chest wall abnormality. No acute or significant osseous findings. Review of the MIP images confirms the above findings. IMPRESSION: 1. No acute intrathoracic abnormality. 2. There is a 7 mm pulmonary nodule in the left upper lobe. Non-contrast chest CT at 6-12 months is recommended. If the nodule is stable at time of repeat CT, then future CT at 18-24 months (from today's scan) is considered optional for low-risk patients, but is recommended for high-risk patients. This recommendation follows the consensus statement: Guidelines for Management of Incidental Pulmonary Nodules Detected on CT  Images: From the Fleischner Society 2017; Radiology 2017; 284:228-243. 3. Mild asymmetric collecting system dilatation on the right with evidence for retained contrast from the prior cardiac catheterization. As the right kidney is only partially visualized on this exam, follow-up with a nonemergent renal ultrasound is recommended for further evaluation of this finding. Aortic Atherosclerosis (ICD10-I70.0) and Emphysema (ICD10-J43.9). Electronically Signed   By: Constance Holster M.D.   On: 09/13/2019 00:17   Vas US Doppler Pre Cabg  Result Date: 09/13/2019 PREOPERATIVE VASCULAR EVALUATION  Risk Factors:     Hypertension, current smoker, coronary artery disease. Other Factors:    History of Hodgkin's Lymphoma. Limitations:      High bifurcation and facial hair Comparison Study: No prior study on file Performing Technologist: Sharion Dove RVS  Examination Guidelines: A complete evaluation includes B-mode imaging, spectral Doppler, color Doppler, and power Doppler as needed of all accessible portions of each vessel. Bilateral testing is considered an integral part of a complete examination. Limited examinations for reoccurring indications may be performed as noted.  Right Carotid Findings: +----------+--------+--------+--------+--------+------------------+  PSV cm/s EDV cm/s Stenosis Describe Comments            +----------+--------+--------+--------+--------+------------------+  CCA Prox   140      17                         intimal thickening  +----------+--------+--------+--------+--------+------------------+  CCA Distal 116      18                         intimal thickening  +----------+--------+--------+--------+--------+------------------+  ICA Prox   67       26                                             +----------+--------+--------+--------+--------+------------------+  ICA Distal 82       31                                              +----------+--------+--------+--------+--------+------------------+  ECA        76       17                                             +----------+--------+--------+--------+--------+------------------+ Portions of this table do not appear on this page. +----------+--------+-------+--------+------------+             PSV cm/s EDV cms Describe Arm Pressure  +----------+--------+-------+--------+------------+  Subclavian 145                       100           +----------+--------+-------+--------+------------+ +---------+--------+--+--------+--+  Vertebral PSV cm/s 49 EDV cm/s 14  +---------+--------+--+--------+--+ Left Carotid Findings: +----------+--------+--------+--------+--------+------------------+             PSV cm/s EDV cm/s Stenosis Describe Comments            +----------+--------+--------+--------+--------+------------------+  CCA Prox   116      27                         intimal thickening  +----------+--------+--------+--------+--------+------------------+  CCA Distal 90       20                         intimal thickening  +----------+--------+--------+--------+--------+------------------+  ICA Prox   73       25                                             +----------+--------+--------+--------+--------+------------------+  ICA Distal 71       22                                             +----------+--------+--------+--------+--------+------------------+  ECA        96       11                                             +----------+--------+--------+--------+--------+------------------+ +----------+--------+--------+--------+------------+  Subclavian PSV cm/s EDV cm/s Describe Arm Pressure  +----------+--------+--------+--------+------------+             170                        90            +----------+--------+--------+--------+------------+ +---------+--------+--+--------+--+  Vertebral PSV cm/s 61 EDV cm/s 18  +---------+--------+--+--------+--+  ABI Findings:  +--------+------------------+-----+---------+--------+  Right    Rt Pressure (mmHg) Index Waveform  Comment   +--------+------------------+-----+---------+--------+  Brachial 100                      triphasic           +--------+------------------+-----+---------+--------+  PTA                               triphasic           +--------+------------------+-----+---------+--------+  DP                                triphasic           +--------+------------------+-----+---------+--------+ +--------+------------------+-----+---------+-------+  Left     Lt Pressure (mmHg) Index Waveform  Comment  +--------+------------------+-----+---------+-------+  Brachial 90                       triphasic          +--------+------------------+-----+---------+-------+  PTA                               triphasic          +--------+------------------+-----+---------+-------+  DP                                triphasic          +--------+------------------+-----+---------+-------+  Right Doppler Findings: +-----------+--------+-----+---------+-----------------------------------------+  Site        Pressure Index Doppler   Comments                                   +-----------+--------+-----+---------+-----------------------------------------+  Brachial    100            triphasic                                            +-----------+--------+-----+---------+-----------------------------------------+  Radial                     triphasic                                            +-----------+--------+-----+---------+-----------------------------------------+  Ulnar                      triphasic                                            +-----------+--------+-----+---------+-----------------------------------------+  Palmar Arch                          Doppler signal remains normal with radial                                        compression and nearly obliterates with                                          ulnar  compression                          +-----------+--------+-----+---------+-----------------------------------------+  Left Doppler Findings: +-----------+--------+-----+---------+-----------------------------------------+  Site        Pressure Index Doppler   Comments                                   +-----------+--------+-----+---------+-----------------------------------------+  Brachial    90             triphasic                                            +-----------+--------+-----+---------+-----------------------------------------+  Radial                     triphasic                                            +-----------+--------+-----+---------+-----------------------------------------+  Ulnar                      triphasic                                            +-----------+--------+-----+---------+-----------------------------------------+  Palmar Arch                          Doppler signal remains normal with radial                                        compression and nearly obliterates with                                          ulnar compression                          +-----------+--------+-----+---------+-----------------------------------------+  Summary: Right Carotid: The extracranial vessels were near-normal with only minimal wall                thickening or plaque. Left Carotid: The extracranial vessels were near-normal with only minimal wall  thickening or plaque. Vertebrals:  Bilateral vertebral arteries demonstrate antegrade flow. Subclavians: Normal flow hemodynamics were seen in bilateral subclavian              arteries. Right Upper Extremity: Doppler waveforms remain within normal limits with right radial compression. Doppler waveforms nearly obliterate with right ulnar compression. Left Upper Extremity: Doppler waveforms remain within normal limits with left radial compression. Doppler waveforms nearly obliterate with left ulnar compression.    Preliminary       LOS: 3 days   Oren Binet, MD  [facility]  If 7PM-7AM, please contact night-coverage  Please page via [email or web address]  Go to [email or web address] and use [place]'s universal password to access. If you do not have the password, please contact the hospital operator.  Locate the [facility] provider you are looking for under [facility] and page to a number that you can be directly reached. If you still have difficulty reaching the provider, please page the [facility] (Director on Call) for the Hospitalists listed on amion for assistance.  09/14/2019, 9:21 AM

## 2019-09-14 NOTE — Progress Notes (Signed)
1600 Noted hematoma, right radial when rounding on patient. Pressure held on site for 20 minutes, level 0. Patient instructed not to use his right arm until tomorrow and reviewed clot on radial artery is now easily dislodged and will bleed again. Patient verbalized understanding.  1935 right radial site level 1 hematoma, and bleeding from site. Pressure held for 15 minutes. New dressing applied. Site level 0 with no hematoma or bleeding noted. Instruction reviewed with patient again and verbalized understanding and will report any changes to nurse immediately when noted. Report to Oliver Pila RN

## 2019-09-15 ENCOUNTER — Other Ambulatory Visit (HOSPITAL_COMMUNITY): Payer: Medicare HMO

## 2019-09-15 DIAGNOSIS — I259 Chronic ischemic heart disease, unspecified: Secondary | ICD-10-CM

## 2019-09-15 DIAGNOSIS — I25118 Atherosclerotic heart disease of native coronary artery with other forms of angina pectoris: Secondary | ICD-10-CM

## 2019-09-15 LAB — URINALYSIS, ROUTINE W REFLEX MICROSCOPIC
Bilirubin Urine: NEGATIVE
Glucose, UA: NEGATIVE mg/dL
Ketones, ur: 5 mg/dL — AB
Leukocytes,Ua: NEGATIVE
Nitrite: NEGATIVE
Protein, ur: NEGATIVE mg/dL
Specific Gravity, Urine: 1.021 (ref 1.005–1.030)
pH: 5 (ref 5.0–8.0)

## 2019-09-15 LAB — BASIC METABOLIC PANEL
Anion gap: 11 (ref 5–15)
BUN: 12 mg/dL (ref 6–20)
CO2: 24 mmol/L (ref 22–32)
Calcium: 9.5 mg/dL (ref 8.9–10.3)
Chloride: 106 mmol/L (ref 98–111)
Creatinine, Ser: 0.93 mg/dL (ref 0.61–1.24)
GFR calc Af Amer: >60 mL/min (ref >60)
GFR calc non Af Amer: >60 mL/min (ref >60)
Glucose, Bld: 88 mg/dL (ref 70–99)
Potassium: 3.8 mmol/L (ref 3.5–5.1)
Sodium: 141 mmol/L (ref 135–145)

## 2019-09-15 LAB — BLOOD GAS, ARTERIAL
Acid-Base Excess: 0.9 mmol/L (ref 0.0–2.0)
Bicarbonate: 24.9 mmol/L (ref 20.0–28.0)
Drawn by: 535471
O2 Saturation: 97.6 %
Patient temperature: 98.6
pCO2 arterial: 39.6 mmHg (ref 32.0–48.0)
pH, Arterial: 7.416 (ref 7.350–7.450)
pO2, Arterial: 92.9 mmHg (ref 83.0–108.0)

## 2019-09-15 LAB — COMPREHENSIVE METABOLIC PANEL
ALT: 14 U/L (ref 0–44)
AST: 17 U/L (ref 15–41)
Albumin: 4 g/dL (ref 3.5–5.0)
Alkaline Phosphatase: 53 U/L (ref 38–126)
Anion gap: 10 (ref 5–15)
BUN: 13 mg/dL (ref 6–20)
CO2: 23 mmol/L (ref 22–32)
Calcium: 9.5 mg/dL (ref 8.9–10.3)
Chloride: 106 mmol/L (ref 98–111)
Creatinine, Ser: 0.94 mg/dL (ref 0.61–1.24)
GFR calc Af Amer: >60 mL/min (ref >60)
GFR calc non Af Amer: >60 mL/min (ref >60)
Glucose, Bld: 83 mg/dL (ref 70–99)
Potassium: 4.3 mmol/L (ref 3.5–5.1)
Sodium: 139 mmol/L (ref 135–145)
Total Bilirubin: 1 mg/dL (ref 0.3–1.2)
Total Protein: 6.9 g/dL (ref 6.5–8.1)

## 2019-09-15 LAB — HEMOGLOBIN A1C
Hgb A1c MFr Bld: 5.3 % (ref 4.8–5.6)
Mean Plasma Glucose: 105.41 mg/dL

## 2019-09-15 LAB — CBC
HCT: 40 % (ref 39.0–52.0)
Hemoglobin: 14.1 g/dL (ref 13.0–17.0)
MCH: 33.4 pg (ref 26.0–34.0)
MCHC: 35.3 g/dL (ref 30.0–36.0)
MCV: 94.8 fL (ref 80.0–100.0)
Platelets: 162 10*3/uL (ref 150–400)
RBC: 4.22 MIL/uL (ref 4.22–5.81)
RDW: 12.5 % (ref 11.5–15.5)
WBC: 11.9 10*3/uL — ABNORMAL HIGH (ref 4.0–10.5)
nRBC: 0 % (ref 0.0–0.2)

## 2019-09-15 LAB — ABO/RH: ABO/RH(D): A POS

## 2019-09-15 LAB — HEPARIN LEVEL (UNFRACTIONATED): Heparin Unfractionated: 0.59 IU/mL (ref 0.30–0.70)

## 2019-09-15 LAB — PROTIME-INR
INR: 1.1 (ref 0.8–1.2)
Prothrombin Time: 14 seconds (ref 11.4–15.2)

## 2019-09-15 LAB — SURGICAL PCR SCREEN
MRSA, PCR: POSITIVE — AB
Staphylococcus aureus: POSITIVE — AB

## 2019-09-15 LAB — APTT: aPTT: 106 seconds — ABNORMAL HIGH (ref 24–36)

## 2019-09-15 MED ORDER — PHENYLEPHRINE HCL-NACL 20-0.9 MG/250ML-% IV SOLN
30.0000 ug/min | INTRAVENOUS | Status: DC
  Filled 2019-09-15: qty 250

## 2019-09-15 MED ORDER — TEMAZEPAM 15 MG PO CAPS
15.0000 mg | ORAL_CAPSULE | Freq: Once | ORAL | Status: AC | PRN
  Administered 2019-09-15: 15 mg via ORAL
  Filled 2019-09-15: qty 1

## 2019-09-15 MED ORDER — VANCOMYCIN HCL 10 G IV SOLR
1250.0000 mg | INTRAVENOUS | Status: AC
  Administered 2019-09-16: 07:00:00 1250 mg via INTRAVENOUS
  Filled 2019-09-15: qty 1250

## 2019-09-15 MED ORDER — TRANEXAMIC ACID (OHS) BOLUS VIA INFUSION
15.0000 mg/kg | INTRAVENOUS | Status: AC
  Administered 2019-09-16: 906 mg via INTRAVENOUS
  Filled 2019-09-15: qty 906

## 2019-09-15 MED ORDER — CHLORHEXIDINE GLUCONATE 0.12 % MT SOLN
15.0000 mL | Freq: Once | OROMUCOSAL | Status: AC
  Administered 2019-09-16: 05:00:00 15 mL via OROMUCOSAL
  Filled 2019-09-15: qty 15

## 2019-09-15 MED ORDER — MAGNESIUM SULFATE 50 % IJ SOLN
40.0000 meq | INTRAMUSCULAR | Status: DC
  Filled 2019-09-15: qty 9.85

## 2019-09-15 MED ORDER — SODIUM CHLORIDE 0.9 % IV SOLN
750.0000 mg | INTRAVENOUS | Status: DC
  Filled 2019-09-15: qty 750

## 2019-09-15 MED ORDER — CHLORHEXIDINE GLUCONATE CLOTH 2 % EX PADS
6.0000 | MEDICATED_PAD | Freq: Once | CUTANEOUS | Status: AC
  Administered 2019-09-15: 6 via TOPICAL

## 2019-09-15 MED ORDER — EPINEPHRINE HCL 5 MG/250ML IV SOLN IN NS
0.0000 ug/min | INTRAVENOUS | Status: DC
  Filled 2019-09-15: qty 250

## 2019-09-15 MED ORDER — NOREPINEPHRINE 4 MG/250ML-% IV SOLN
0.0000 ug/min | INTRAVENOUS | Status: DC
  Filled 2019-09-15: qty 250

## 2019-09-15 MED ORDER — MILRINONE LACTATE IN DEXTROSE 20-5 MG/100ML-% IV SOLN
0.3000 ug/kg/min | INTRAVENOUS | Status: DC
  Filled 2019-09-15: qty 100

## 2019-09-15 MED ORDER — TRANEXAMIC ACID (OHS) PUMP PRIME SOLUTION
2.0000 mg/kg | INTRAVENOUS | Status: DC
  Filled 2019-09-15: qty 1.21

## 2019-09-15 MED ORDER — DEXMEDETOMIDINE HCL IN NACL 400 MCG/100ML IV SOLN
0.1000 ug/kg/h | INTRAVENOUS | Status: DC
  Filled 2019-09-15: qty 100

## 2019-09-15 MED ORDER — DOPAMINE-DEXTROSE 3.2-5 MG/ML-% IV SOLN
0.0000 ug/kg/min | INTRAVENOUS | Status: DC
  Filled 2019-09-15: qty 250

## 2019-09-15 MED ORDER — BISACODYL 5 MG PO TBEC
5.0000 mg | DELAYED_RELEASE_TABLET | Freq: Once | ORAL | Status: AC
  Administered 2019-09-15: 5 mg via ORAL
  Filled 2019-09-15: qty 1

## 2019-09-15 MED ORDER — INSULIN REGULAR(HUMAN) IN NACL 100-0.9 UT/100ML-% IV SOLN
INTRAVENOUS | Status: DC
  Filled 2019-09-15: qty 100

## 2019-09-15 MED ORDER — POTASSIUM CHLORIDE 2 MEQ/ML IV SOLN
80.0000 meq | INTRAVENOUS | Status: DC
  Filled 2019-09-15: qty 40

## 2019-09-15 MED ORDER — NITROGLYCERIN IN D5W 200-5 MCG/ML-% IV SOLN
2.0000 ug/min | INTRAVENOUS | Status: DC
  Filled 2019-09-15: qty 250

## 2019-09-15 MED ORDER — SODIUM CHLORIDE 0.9 % IV SOLN
1.5000 g | INTRAVENOUS | Status: AC
  Administered 2019-09-16: 1.5 g via INTRAVENOUS
  Filled 2019-09-15: qty 1.5

## 2019-09-15 MED ORDER — TRANEXAMIC ACID 1000 MG/10ML IV SOLN
1.5000 mg/kg/h | INTRAVENOUS | Status: DC
  Filled 2019-09-15: qty 25

## 2019-09-15 MED ORDER — PLASMA-LYTE 148 IV SOLN
INTRAVENOUS | Status: DC
  Filled 2019-09-15: qty 2.5

## 2019-09-15 MED ORDER — ACETAMINOPHEN 500 MG PO TABS
1000.0000 mg | ORAL_TABLET | Freq: Once | ORAL | Status: AC
  Administered 2019-09-15: 1000 mg via ORAL
  Filled 2019-09-15: qty 2

## 2019-09-15 MED ORDER — VANCOMYCIN HCL 1000 MG IV SOLR
INTRAVENOUS | Status: AC
  Administered 2019-09-16: 1000 mL
  Filled 2019-09-15: qty 1000

## 2019-09-15 MED ORDER — SODIUM CHLORIDE 0.9 % IV SOLN
INTRAVENOUS | Status: DC
  Filled 2019-09-15: qty 30

## 2019-09-15 MED ORDER — MUPIROCIN 2 % EX OINT
1.0000 "application " | TOPICAL_OINTMENT | Freq: Two times a day (BID) | CUTANEOUS | Status: DC
  Administered 2019-09-15 (×2): 1 via NASAL
  Filled 2019-09-15: qty 22

## 2019-09-15 MED ORDER — METOPROLOL TARTRATE 12.5 MG HALF TABLET
12.5000 mg | ORAL_TABLET | Freq: Once | ORAL | Status: AC
  Administered 2019-09-16: 12.5 mg via ORAL
  Filled 2019-09-15: qty 1

## 2019-09-15 NOTE — Progress Notes (Signed)
Progress Note  Patient Name: Russell Harris Date of Encounter: 09/15/2019  Primary Cardiologist:  Tia Alert ( new)   Subjective   53 year old gentleman with a history of Hodgkin's lymphoma, COPD, tobacco abuse he was admitted to [facility] and was found to have a positive stress test.  He was transferred to [facility] for heart catheterization..  Dr. Claiborne Billings recommended surgical consultation for possible CABG. He has been seen by Murvin Natal and the plan is foro CABG on Sept. 22, 2020    Inpatient Medications    Scheduled Meds: . aspirin  81 mg Oral Daily  . atorvastatin  80 mg Oral q1800  . bisacodyl  5 mg Oral Once  . [START ON 09/16/2019] chlorhexidine  15 mL Mouth/Throat Once  . Chlorhexidine Gluconate Cloth  6 each Topical Once   And  . Chlorhexidine Gluconate Cloth  6 each Topical Once  . influenza vac split quadrivalent PF  0.5 mL Intramuscular Tomorrow-1000  . isosorbide mononitrate  30 mg Oral Daily  . [START ON 09/16/2019] metoprolol tartrate  12.5 mg Oral Once  . nicotine  21 mg Transdermal Daily  . sodium chloride flush  3 mL Intravenous Q12H   Continuous Infusions: . sodium chloride Stopped (09/14/19 1256)  . heparin 950 Units/hr (09/14/19 2105)   PRN Meds: sodium chloride, acetaminophen, diazepam, ondansetron (ZOFRAN) IV, sodium chloride flush, temazepam   Vital Signs    Vitals:   09/14/19 0925 09/14/19 1428 09/14/19 2039 09/15/19 0503  BP:  103/63 103/61 103/60  Pulse: 60 (!) 59 (!) 58 (!) 58  Resp:      Temp:  98.7 F (37.1 C) 98.6 F (37 C) 97.7 F (36.5 C)  TempSrc:  Oral Oral Oral  SpO2:  97% 97% 98%  Weight:    60.4 kg  Height:        Intake/Output Summary (Last 24 hours) at 09/15/2019 0856 Last data filed at 09/15/2019 0506 Gross per 24 hour  Intake 429.35 ml  Output 2100 ml  Net -1670.65 ml   Last 3 Weights 09/15/2019 09/14/2019 09/13/2019  Weight (lbs) 133 lb 1.6 oz 134 lb 4.8 oz 133 lb 9.6 oz  Weight (kg) 60.374 kg 60.918  kg 60.6 kg      Telemetry     NSR  - Personally Reviewed  ECG     NS$  - Personally Reviewed  Physical Exam   GEN:  thin, middle age male, NAD  Neck: No JVD Cardiac: RRR, no murmurs, rubs, or gallops.  Respiratory: Clear to auscultation bilaterally. GI: Soft, nontender, non-distended  MS: No edema; No deformity. Neuro:  Nonfocal  Psych: Normal affect   Labs    High Sensitivity Troponin:   Recent Labs  Lab 09/11/19 2148 09/12/19 0013  TROPONINIHS 5 5      Chemistry Recent Labs  Lab 09/12/19 0246 09/13/19 0441 09/15/19 0509  NA 140 138 141  K 3.7 3.8 3.8  CL 103 105 106  CO2 25 23 24   GLUCOSE 75 81 88  BUN 9 13 12   CREATININE 0.93 1.04 0.93  CALCIUM 9.4 9.2 9.5  GFRNONAA >60 >60 >60  GFRAA >60 >60 >60  ANIONGAP 12 10 11      Hematology Recent Labs  Lab 09/13/19 0441 09/14/19 0355 09/15/19 0509  WBC 12.7* 10.5 11.9*  RBC 4.07* 4.03* 4.22  HGB 13.7 13.1 14.1  HCT 39.2 38.7* 40.0  MCV 96.3 96.0 94.8  MCH 33.7 32.5 33.4  MCHC 34.9 33.9 35.3  RDW 12.8 12.7 12.5  PLT 174 161 162    BNPNo results for input(s): BNP, PROBNP in the last 168 hours.   DDimer No results for input(s): DDIMER in the last 168 hours.   Radiology    Vas US Doppler Pre Cabg  Result Date: 09/14/2019 PREOPERATIVE VASCULAR EVALUATION  Risk Factors:     Hypertension, current smoker, coronary artery disease. Other Factors:    History of Hodgkin's Lymphoma. Limitations:      High bifurcation and facial hair Comparison Study: No prior study on file Performing Technologist: Sharion Dove RVS  Examination Guidelines: A complete evaluation includes B-mode imaging, spectral Doppler, color Doppler, and power Doppler as needed of all accessible portions of each vessel. Bilateral testing is considered an integral part of a complete examination. Limited examinations for reoccurring indications may be performed as noted.  Right Carotid Findings:  +----------+--------+--------+--------+--------+------------------+           PSV cm/sEDV cm/sStenosisDescribeComments           +----------+--------+--------+--------+--------+------------------+ CCA Prox  140     17                      intimal thickening +----------+--------+--------+--------+--------+------------------+ CCA Distal116     18                      intimal thickening +----------+--------+--------+--------+--------+------------------+ ICA Prox  67      26                                         +----------+--------+--------+--------+--------+------------------+ ICA Distal82      31                                         +----------+--------+--------+--------+--------+------------------+ ECA       76      17                                         +----------+--------+--------+--------+--------+------------------+ Portions of this table do not appear on this page. +----------+--------+-------+--------+------------+           PSV cm/sEDV cmsDescribeArm Pressure +----------+--------+-------+--------+------------+ Subclavian145                    100          +----------+--------+-------+--------+------------+ +---------+--------+--+--------+--+ VertebralPSV cm/s49EDV cm/s14 +---------+--------+--+--------+--+ Left Carotid Findings: +----------+--------+--------+--------+--------+------------------+           PSV cm/sEDV cm/sStenosisDescribeComments           +----------+--------+--------+--------+--------+------------------+ CCA Prox  116     27                      intimal thickening +----------+--------+--------+--------+--------+------------------+ CCA Distal90      20                      intimal thickening +----------+--------+--------+--------+--------+------------------+ ICA Prox  73      25                                         +----------+--------+--------+--------+--------+------------------+  ICA  Distal71      22                                         +----------+--------+--------+--------+--------+------------------+ ECA       96      11                                         +----------+--------+--------+--------+--------+------------------+ +----------+--------+--------+--------+------------+ SubclavianPSV cm/sEDV cm/sDescribeArm Pressure +----------+--------+--------+--------+------------+           170                     90           +----------+--------+--------+--------+------------+ +---------+--------+--+--------+--+ VertebralPSV cm/s61EDV cm/s18 +---------+--------+--+--------+--+  ABI Findings: +--------+------------------+-----+---------+--------+ Right   Rt Pressure (mmHg)IndexWaveform Comment  +--------+------------------+-----+---------+--------+ Brachial100                    triphasic         +--------+------------------+-----+---------+--------+ PTA                            triphasic         +--------+------------------+-----+---------+--------+ DP                             triphasic         +--------+------------------+-----+---------+--------+ +--------+------------------+-----+---------+-------+ Left    Lt Pressure (mmHg)IndexWaveform Comment +--------+------------------+-----+---------+-------+ Brachial90                     triphasic        +--------+------------------+-----+---------+-------+ PTA                            triphasic        +--------+------------------+-----+---------+-------+ DP                             triphasic        +--------+------------------+-----+---------+-------+  Right Doppler Findings: +-----------+--------+-----+---------+-----------------------------------------+ Site       PressureIndexDoppler  Comments                                  +-----------+--------+-----+---------+-----------------------------------------+ Brachial   100          triphasic                                           +-----------+--------+-----+---------+-----------------------------------------+ Radial                  triphasic                                          +-----------+--------+-----+---------+-----------------------------------------+ Ulnar                   triphasic                                          +-----------+--------+-----+---------+-----------------------------------------+  Palmar Arch                      Doppler signal remains normal with radial                                  compression and nearly obliterates with                                    ulnar compression                         +-----------+--------+-----+---------+-----------------------------------------+  Left Doppler Findings: +-----------+--------+-----+---------+-----------------------------------------+ Site       PressureIndexDoppler  Comments                                  +-----------+--------+-----+---------+-----------------------------------------+ Brachial   90           triphasic                                          +-----------+--------+-----+---------+-----------------------------------------+ Radial                  triphasic                                          +-----------+--------+-----+---------+-----------------------------------------+ Ulnar                   triphasic                                          +-----------+--------+-----+---------+-----------------------------------------+ Palmar Arch                      Doppler signal remains normal with radial                                  compression and nearly obliterates with                                    ulnar compression                         +-----------+--------+-----+---------+-----------------------------------------+  Summary: Right Carotid: The extracranial vessels were near-normal with only minimal wall                 thickening or plaque. Left Carotid: The extracranial vessels were near-normal with only minimal wall               thickening or plaque. Vertebrals:  Bilateral vertebral arteries demonstrate antegrade flow. Subclavians: Normal flow hemodynamics were seen in bilateral subclavian              arteries. Right Upper Extremity: Doppler waveforms remain within normal limits with right radial compression. Doppler waveforms nearly obliterate with right ulnar compression. Left  Upper Extremity: Doppler waveforms remain within normal limits with left radial compression. Doppler waveforms nearly obliterate with left ulnar compression.  Electronically signed by Ruta Hinds MD on 09/14/2019 at 11:00:53 AM.    Final     Cardiac Studies     Patient Profile     53 y.o. male with CAD , COPD  Assessment & Plan    1.  CAD :  Scheduled for coronary artery bypass grafting tomorrow.  He is not having any angina.  2.  Hypertension: Blood pressure seems to be well controlled.      For questions or updates, please contact [place] Please consult [email or web address] for contact info under        Signed, Mertie Moores, MD  09/15/2019, 8:56 AM

## 2019-09-15 NOTE — Progress Notes (Signed)
[facility] Progress Note  Pt seen and examined; ready for surgery tomorrow. Plan CABG x 3 with bilateral internal mammary arteries and left radial artery harvesting.   Russell Harris Z. Orvan Seen, [place]

## 2019-09-15 NOTE — Progress Notes (Signed)
PROGRESS NOTE        PATIENT DETAILS Name: Russell Harris Age: 53 y.o. Sex: male Date of Birth: [birth year] Admit Date: 09/11/2019 Admitting Physician Mercy Riding, MD [id number], Sharon Mt, MD  Brief Narrative: Patient is a 53 y.o. male with history of Hodgkin's lymphoma-currently in remission, COPD, tobacco use-who presented as a transfer from [facility] for a positive stress test which was performed after patient presented with chest pain.  Subjective: Lying comfortably in bed-denies any chest pain or shortness of breath.  Assessment/Plan: Chest pain secondary to unstable angina: Has underlying multivessel disease on LHC-cardiothoracic surgery and cardiology following.  Tentative plans are for CABG on 9/22.  Continue aspirin, statin and IV heparin.   History of Hodgkin's lymphoma: In remission-patient to resume follow-up with oncology in the outpatient setting  COPD: No sign of exacerbation-continue supportive care  Tobacco use: Counseled  Diet: Diet Order            Diet NPO time specified  Diet effective midnight        Diet Heart Room service appropriate? Yes; Fluid consistency: Thin  Diet effective now               DVT Prophylaxis: Full dose anticoagulation with Heparin  Code Status: Full code  Family Communication: None at bedside  Disposition Plan: Remain inpatient  Barriers to discharge: CABG on 9/22  Antimicrobial agents: Anti-infectives (From admission, onward)   Start     Dose/Rate Route Frequency Ordered Stop   09/16/19 0400  vancomycin (VANCOCIN) 1,000 mg in sodium chloride 0.9 % 1,000 mL irrigation      Irrigation To Surgery 09/15/19 0943 09/17/19 0400   09/16/19 0400  vancomycin (VANCOCIN) 1,250 mg in sodium chloride 0.9 % 250 mL IVPB     1,250 mg 166.7 mL/hr over 90 Minutes Intravenous To Surgery 09/15/19 0943 09/17/19 0400   09/16/19 0400  cefUROXime (ZINACEF) 1.5 g in sodium chloride 0.9 % 100 mL IVPB     1.5 g 200 mL/hr over 30 Minutes Intravenous To Surgery 09/15/19 0943 09/17/19 0400   09/16/19 0400  cefUROXime (ZINACEF) 750 mg in sodium chloride 0.9 % 100 mL IVPB     750 mg 200 mL/hr over 30 Minutes Intravenous To Surgery 09/15/19 0943 09/17/19 0400      Procedures: LHC performed on 9/18  CONSULTS:  cardiology  Cardiothoracic surgery  Time spent: 15 minutes-Greater than 50% of this time was spent in counseling, explanation of diagnosis, planning of further management, and coordination of care.  MEDICATIONS: Scheduled Meds:  aspirin  81 mg Oral Daily   atorvastatin  80 mg Oral q1800   [START ON 09/16/2019] chlorhexidine  15 mL Mouth/Throat Once   Chlorhexidine Gluconate Cloth  6 each Topical Once   And   Chlorhexidine Gluconate Cloth  6 each Topical Once   [START ON 09/16/2019] epinephrine  0-10 mcg/min Intravenous To OR   [START ON 09/16/2019] heparin-papaverine-plasmalyte irrigation   Irrigation To OR   [START ON 09/16/2019] insulin   Intravenous To OR   isosorbide mononitrate  30 mg Oral Daily   [START ON 09/16/2019] magnesium sulfate  40 mEq Other To OR   [START ON 09/16/2019] metoprolol tartrate  12.5 mg Oral Once   nicotine  21 mg Transdermal Daily   [START ON 09/16/2019] phenylephrine  30-200 mcg/min Intravenous To OR   [  START ON 09/16/2019] potassium chloride  80 mEq Other To OR   sodium chloride flush  3 mL Intravenous Q12H   [START ON 09/16/2019] tranexamic acid  15 mg/kg Intravenous To OR   [START ON 09/16/2019] tranexamic acid  2 mg/kg Intracatheter To OR   [START ON 09/16/2019] vancomycin 1000 mg in NS (1000 ml) irrigation for Dr. Roxy Manns case   Irrigation To OR   Continuous Infusions:  sodium chloride Stopped (09/14/19 1256)   [START ON 09/16/2019] cefUROXime (ZINACEF)  IV     [START ON 09/16/2019] cefUROXime (ZINACEF)  IV     [START ON 09/16/2019] dexmedetomidine     [START ON 09/16/2019] DOPamine     [START ON 09/16/2019] heparin 30,000 units/NS  1000 mL solution for CELLSAVER     heparin 950 Units/hr (09/14/19 2105)   [START ON 09/16/2019] milrinone     [START ON 09/16/2019] nitroGLYCERIN     [START ON 09/16/2019] norepinephrine     [START ON 09/16/2019] tranexamic acid (CYKLOKAPRON) infusion ([facility])     [START ON 09/16/2019] vancomycin     PRN Meds:.sodium chloride, acetaminophen, diazepam, ondansetron (ZOFRAN) IV, sodium chloride flush, temazepam   PHYSICAL EXAM: Vital signs: Vitals:   09/14/19 0925 09/14/19 1428 09/14/19 2039 09/15/19 0503  BP:  103/63 103/61 103/60  Pulse: 60 (!) 59 (!) 58 (!) 58  Resp:      Temp:  98.7 F (37.1 C) 98.6 F (37 C) 97.7 F (36.5 C)  TempSrc:  Oral Oral Oral  SpO2:  97% 97% 98%  Weight:    60.4 kg  Height:       Filed Weights   09/13/19 0514 09/14/19 0620 09/15/19 0503  Weight: 60.6 kg 60.9 kg 60.4 kg   Body mass index is 18.05 kg/m.   Gen Exam:Alert awake-not in any distress HEENT:atraumatic, normocephalic Chest: B/L clear to auscultation anteriorly CVS:S1S2 regular Abdomen:soft non tender, non distended Extremities:no edema Neurology: Non focal Skin: no rash  I have personally reviewed following labs and imaging studies  LABORATORY DATA: CBC: Recent Labs  Lab 09/12/19 0232 09/13/19 0441 09/14/19 0355 09/15/19 0509  WBC 9.8 12.7* 10.5 11.9*  HGB 13.9 13.7 13.1 14.1  HCT 40.4 39.2 38.7* 40.0  MCV 96.4 96.3 96.0 94.8  PLT 162 174 161 0000000    Basic Metabolic Panel: Recent Labs  Lab 09/12/19 0246 09/13/19 0441 09/15/19 0509  NA 140 138 141  K 3.7 3.8 3.8  CL 103 105 106  CO2 25 23 24   GLUCOSE 75 81 88  BUN 9 13 12   CREATININE 0.93 1.04 0.93  CALCIUM 9.4 9.2 9.5    GFR: Estimated Creatinine Clearance: 76.7 mL/min (by C-G formula based on SCr of 0.93 mg/dL).  Liver Function Tests: No results for input(s): AST, ALT, ALKPHOS, BILITOT, PROT, ALBUMIN in the last 168 hours. No results for input(s): LIPASE, AMYLASE in the last 168 hours. No results for  input(s): AMMONIA in the last 168 hours.  Coagulation Profile: No results for input(s): INR, PROTIME in the last 168 hours.  Cardiac Enzymes: No results for input(s): CKTOTAL, CKMB, CKMBINDEX, TROPONINI in the last 168 hours.  BNP (last 3 results) No results for input(s): PROBNP in the last 8760 hours.  HbA1C: No results for input(s): HGBA1C in the last 72 hours.  CBG: No results for input(s): GLUCAP in the last 168 hours.  Lipid Profile: No results for input(s): CHOL, HDL, LDLCALC, TRIG, CHOLHDL, LDLDIRECT in the last 72 hours.  Thyroid Function Tests: No results for  input(s): TSH, T4TOTAL, FREET4, T3FREE, THYROIDAB in the last 72 hours.  Anemia Panel: No results for input(s): VITAMINB12, FOLATE, FERRITIN, TIBC, IRON, RETICCTPCT in the last 72 hours.  Urine analysis: No results found for: COLORURINE, APPEARANCEUR, LABSPEC, PHURINE, GLUCOSEU, HGBUR, BILIRUBINUR, KETONESUR, PROTEINUR, UROBILINOGEN, NITRITE, LEUKOCYTESUR  Sepsis Labs: Lactic Acid, Venous No results found for: LATICACIDVEN  MICROBIOLOGY: Recent Results (from the past 240 hour(s))  SARS Coronavirus 2 [facility] order, Performed in [facility] hospital lab) Nasopharyngeal Nasopharyngeal Swab     Status: None   Collection Time: 09/12/19  5:12 AM   Specimen: Nasopharyngeal Swab  Result Value Ref Range Status   SARS Coronavirus 2 NEGATIVE NEGATIVE Final    Comment: (NOTE) If result is NEGATIVE SARS-CoV-2 target nucleic acids are NOT DETECTED. The SARS-CoV-2 RNA is generally detectable in upper and lower  respiratory specimens during the acute phase of infection. The lowest  concentration of SARS-CoV-2 viral copies this assay can detect is 250  copies / mL. A negative result does not preclude SARS-CoV-2 infection  and should not be used as the sole basis for treatment or other  patient management decisions.  A negative result may occur with  improper specimen collection / handling, submission of specimen other    than nasopharyngeal swab, presence of viral mutation(s) within the  areas targeted by this assay, and inadequate number of viral copies  (<250 copies / mL). A negative result must be combined with clinical  observations, patient history, and epidemiological information. If result is POSITIVE SARS-CoV-2 target nucleic acids are DETECTED. The SARS-CoV-2 RNA is generally detectable in upper and lower  respiratory specimens dur ing the acute phase of infection.  Positive  results are indicative of active infection with SARS-CoV-2.  Clinical  correlation with patient history and other diagnostic information is  necessary to determine patient infection status.  Positive results do  not rule out bacterial infection or co-infection with other viruses. If result is PRESUMPTIVE POSTIVE SARS-CoV-2 nucleic acids MAY BE PRESENT.   A presumptive positive result was obtained on the submitted specimen  and confirmed on repeat testing.  While 2019 novel coronavirus  (SARS-CoV-2) nucleic acids may be present in the submitted sample  additional confirmatory testing may be necessary for epidemiological  and / or clinical management purposes  to differentiate between  SARS-CoV-2 and other Sarbecovirus currently known to infect humans.  If clinically indicated additional testing with an alternate test  methodology [phone]) is advised. The SARS-CoV-2 RNA is generally  detectable in upper and lower respiratory sp ecimens during the acute  phase of infection. The expected result is Negative. Fact Sheet for Patients:  StrictlyIdeas.no Fact Sheet for Healthcare Providers: BankingDealers.co.za This test is not yet approved or cleared by the [place] FDA and has been authorized for detection and/or diagnosis of SARS-CoV-2 by FDA under an Emergency Use Authorization (EUA).  This EUA will remain in effect (meaning this test can be used) for the duration of  the COVID-19 declaration under Section 564(b)(1) of the Act, 21 U.S.C. section 360bbb-3(b)(1), unless the authorization is terminated or revoked sooner. Performed at [facility], [place] [address] [place], [place] [address]     RADIOLOGY STUDIES/RESULTS: Ct Chest Wo Contrast  Result Date: 09/13/2019 CLINICAL DATA:  Aortic disease. Preop CABG. History of chest radiation. EXAM: CT CHEST WITHOUT CONTRAST TECHNIQUE: Multidetector CT imaging of the chest was performed following the standard protocol without IV contrast. COMPARISON:  None. FINDINGS: Cardiovascular: The heart size is normal. There is  no significant pericardial effusion. Coronary artery calcifications are noted. Mild aortic calcifications are noted. There is no evidence for a thoracic aortic aneurysm. Mediastinum/Nodes: --No mediastinal or hilar lymphadenopathy. --No axillary lymphadenopathy. --No supraclavicular lymphadenopathy. --Normal thyroid gland. --The esophagus is unremarkable Lungs/Pleura: There are paraseptal emphysematous changes bilaterally. Apical bulla are noted. There is a 7 mm pulmonary nodule in the left upper lobe (axial series 8, image 23). There is no pneumothorax. There is no significant pleural effusion. There is a trace amount of debris within the lower trachea. Upper Abdomen: There is a branching hyperdense area within the partially visualized upper pole of the right kidney. This may represent contrast from a recent cardiac catheterization. Musculoskeletal: No chest wall abnormality. No acute or significant osseous findings. Review of the MIP images confirms the above findings. IMPRESSION: 1. No acute intrathoracic abnormality. 2. There is a 7 mm pulmonary nodule in the left upper lobe. Non-contrast chest CT at 6-12 months is recommended. If the nodule is stable at time of repeat CT, then future CT at 18-24 months (from today's scan) is considered optional for low-risk patients, but is recommended for high-risk  patients. This recommendation follows the consensus statement: Guidelines for Management of Incidental Pulmonary Nodules Detected on CT Images: From the Fleischner Society 2017; Radiology 2017; 284:228-243. 3. Mild asymmetric collecting system dilatation on the right with evidence for retained contrast from the prior cardiac catheterization. As the right kidney is only partially visualized on this exam, follow-up with a nonemergent renal ultrasound is recommended for further evaluation of this finding. Aortic Atherosclerosis (ICD10-I70.0) and Emphysema (ICD10-J43.9). Electronically Signed   By: Constance Holster M.D.   On: 09/13/2019 00:17   Vas US Doppler Pre Cabg  Result Date: 09/14/2019 PREOPERATIVE VASCULAR EVALUATION  Risk Factors:     Hypertension, current smoker, coronary artery disease. Other Factors:    History of Hodgkin's Lymphoma. Limitations:      High bifurcation and facial hair Comparison Study: No prior study on file Performing Technologist: Sharion Dove RVS  Examination Guidelines: A complete evaluation includes B-mode imaging, spectral Doppler, color Doppler, and power Doppler as needed of all accessible portions of each vessel. Bilateral testing is considered an integral part of a complete examination. Limited examinations for reoccurring indications may be performed as noted.  Right Carotid Findings: +----------+--------+--------+--------+--------+------------------+             PSV cm/s EDV cm/s Stenosis Describe Comments            +----------+--------+--------+--------+--------+------------------+  CCA Prox   140      17                         intimal thickening  +----------+--------+--------+--------+--------+------------------+  CCA Distal 116      18                         intimal thickening  +----------+--------+--------+--------+--------+------------------+  ICA Prox   67       26                                              +----------+--------+--------+--------+--------+------------------+  ICA Distal 82       31                                             +----------+--------+--------+--------+--------+------------------+  ECA        76       17                                             +----------+--------+--------+--------+--------+------------------+ Portions of this table do not appear on this page. +----------+--------+-------+--------+------------+             PSV cm/s EDV cms Describe Arm Pressure  +----------+--------+-------+--------+------------+  Subclavian 145                       100           +----------+--------+-------+--------+------------+ +---------+--------+--+--------+--+  Vertebral PSV cm/s 49 EDV cm/s 14  +---------+--------+--+--------+--+ Left Carotid Findings: +----------+--------+--------+--------+--------+------------------+             PSV cm/s EDV cm/s Stenosis Describe Comments            +----------+--------+--------+--------+--------+------------------+  CCA Prox   116      27                         intimal thickening  +----------+--------+--------+--------+--------+------------------+  CCA Distal 90       20                         intimal thickening  +----------+--------+--------+--------+--------+------------------+  ICA Prox   73       25                                             +----------+--------+--------+--------+--------+------------------+  ICA Distal 71       22                                             +----------+--------+--------+--------+--------+------------------+  ECA        96       11                                             +----------+--------+--------+--------+--------+------------------+ +----------+--------+--------+--------+------------+  Subclavian PSV cm/s EDV cm/s Describe Arm Pressure  +----------+--------+--------+--------+------------+             170                        90            +----------+--------+--------+--------+------------+  +---------+--------+--+--------+--+  Vertebral PSV cm/s 61 EDV cm/s 18  +---------+--------+--+--------+--+  ABI Findings: +--------+------------------+-----+---------+--------+  Right    Rt Pressure (mmHg) Index Waveform  Comment   +--------+------------------+-----+---------+--------+  Brachial 100                      triphasic           +--------+------------------+-----+---------+--------+  PTA                               triphasic           +--------+------------------+-----+---------+--------+  DP                                triphasic           +--------+------------------+-----+---------+--------+ +--------+------------------+-----+---------+-------+  Left     Lt Pressure (mmHg) Index Waveform  Comment  +--------+------------------+-----+---------+-------+  Brachial 90                       triphasic          +--------+------------------+-----+---------+-------+  PTA                               triphasic          +--------+------------------+-----+---------+-------+  DP                                triphasic          +--------+------------------+-----+---------+-------+  Right Doppler Findings: +-----------+--------+-----+---------+-----------------------------------------+  Site        Pressure Index Doppler   Comments                                   +-----------+--------+-----+---------+-----------------------------------------+  Brachial    100            triphasic                                            +-----------+--------+-----+---------+-----------------------------------------+  Radial                     triphasic                                            +-----------+--------+-----+---------+-----------------------------------------+  Ulnar                      triphasic                                            +-----------+--------+-----+---------+-----------------------------------------+  Palmar Arch                          Doppler signal remains normal with radial                                         compression and nearly obliterates with                                          ulnar compression                          +-----------+--------+-----+---------+-----------------------------------------+  Left Doppler Findings: +-----------+--------+-----+---------+-----------------------------------------+  Site        Pressure Index Doppler  Comments                                   +-----------+--------+-----+---------+-----------------------------------------+  Brachial    90             triphasic                                            +-----------+--------+-----+---------+-----------------------------------------+  Radial                     triphasic                                            +-----------+--------+-----+---------+-----------------------------------------+  Ulnar                      triphasic                                            +-----------+--------+-----+---------+-----------------------------------------+  Palmar Arch                          Doppler signal remains normal with radial                                        compression and nearly obliterates with                                          ulnar compression                          +-----------+--------+-----+---------+-----------------------------------------+  Summary: Right Carotid: The extracranial vessels were near-normal with only minimal wall                thickening or plaque. Left Carotid: The extracranial vessels were near-normal with only minimal wall               thickening or plaque. Vertebrals:  Bilateral vertebral arteries demonstrate antegrade flow. Subclavians: Normal flow hemodynamics were seen in bilateral subclavian              arteries. Right Upper Extremity: Doppler waveforms remain within normal limits with right radial compression. Doppler waveforms nearly obliterate with right ulnar compression. Left Upper Extremity: Doppler waveforms remain within normal limits  with left radial compression. Doppler waveforms nearly obliterate with left ulnar compression.  Electronically signed by Ruta Hinds MD on 09/14/2019 at 11:00:53 AM.    Final      LOS: 4 days   Oren Binet, MD  [facility]  If 7PM-7AM, please contact night-coverage  Please page via [email or web address]  Go to [email or web address] and use [facility]'s universal password to access. If you do not have the password, please contact the hospital operator.  Locate the [facility] provider you are looking for under [facility] and page to a number that you  can be directly reached. If you still have difficulty reaching the provider, please page the [facility] (Director on Call) for the Hospitalists listed on amion for assistance.  09/15/2019, 11:34 AM

## 2019-09-15 NOTE — Anesthesia Preprocedure Evaluation (Addendum)
Anesthesia Evaluation  Patient identified by MRN, date of birth, ID band Patient awake    Reviewed: Allergy & Precautions, NPO status , Patient's Chart, lab work & pertinent test results  History of Anesthesia Complications Negative for: history of anesthetic complications  Airway Mallampati: I  TM Distance: >3 FB Neck ROM: Full    Dental  (+) Edentulous Upper, Edentulous Lower, Dental Advisory Given   Pulmonary COPD, Current Smoker,    Pulmonary exam normal        Cardiovascular hypertension, Normal cardiovascular exam   Mid RCA lesion is 20% stenosed.  Prox RCA lesion is 20% stenosed.  Ost Cx to Prox Cx lesion is 50% stenosed.  Prox Cx lesion is 50% stenosed.  1st Mrg lesion is 50% stenosed.  1st Diag lesion is 90% stenosed.  Prox LAD to Mid LAD lesion is 70% stenosed.  Mid LAD to Dist LAD lesion is 99% stenosed with 99% stenosed side branch in 2nd Diag.   IMPRESSIONS    1. Left ventricular ejection fraction, by visual estimation, is 50 to 55%. The left ventricle has mildly decreased function. Normal left ventricular size. There is no left ventricular hypertrophy. False tendon in the left ventricle.  2. Basal and mid inferolateral wall is abnormal.  3. Elevated left ventricular end-diastolic pressure.  4. Left ventricular diastolic Doppler parameters are consistent with impaired relaxation pattern of LV diastolic filling.  5. Global right ventricle has mildly reduced systolic function.The right ventricular size is mildly enlarged. No increase in right ventricular wall thickness.  6. Left atrial size was normal.  7. Right atrial size was normal.  8. The mitral valve is normal in structure. No evidence of mitral valve regurgitation. No evidence of mitral stenosis.  9. The tricuspid valve is normal in structure. Tricuspid valve regurgitation was not visualized by color flow Doppler. 10. The aortic valve is normal in  structure. Aortic valve regurgitation was not visualized by color flow Doppler. Structurally normal aortic valve, with no evidence of sclerosis or stenosis. 11. The pulmonic valve was normal in structure. Pulmonic valve regurgitation is not visualized by color flow Doppler. 12. The inferior vena cava is normal in size with greater than 50% respiratory variability, suggesting right atrial pressure of 3 mmHg.    Neuro/Psych negative neurological ROS     GI/Hepatic negative GI ROS, Neg liver ROS,   Endo/Other  negative endocrine ROS  Renal/GU negative Renal ROS     Musculoskeletal negative musculoskeletal ROS (+)   Abdominal   Peds  Hematology negative hematology ROS (+) Hx of Hodgkin's Lymphoma   Anesthesia Other Findings Day of surgery medications reviewed with the patient.  Reproductive/Obstetrics                            Anesthesia Physical Anesthesia Plan  ASA: IV  Anesthesia Plan: General   Post-op Pain Management:    Induction: Intravenous  PONV Risk Score and Plan: 2 and Ondansetron, Dexamethasone and Treatment may vary due to age or medical condition  Airway Management Planned: Oral ETT  Additional Equipment: Arterial line, PA Cath, 3D TEE and Ultrasound Guidance Line Placement  Intra-op Plan:   Post-operative Plan: Post-operative intubation/ventilation  Informed Consent: I have reviewed the patients History and Physical, chart, labs and discussed the procedure including the risks, benefits and alternatives for the proposed anesthesia with the patient or authorized representative who has indicated his/her understanding and acceptance.     Dental advisory given  Plan Discussed with: CRNA and Anesthesiologist  Anesthesia Plan Comments:        Anesthesia Quick Evaluation

## 2019-09-15 NOTE — Progress Notes (Signed)
[id number] Gave pt OHS booklet and wrote down how to view pre op video. Gave staying in the tube handout and discussed sternal precautions. Gave IS and pt demonstrated 2500 ml correctly but HR went from 66 to 52. Encouraged pt to get so high right now. Discussed importance of IS and walking after surgery. Pt stated he has friends and family to help with care after discharge. Offered to walk but pt declined at this time as he is tired. Encouraged to walk with staff later. Will follow up after surgery. Graylon Good RN BSN 09/15/2019 9:15 AM

## 2019-09-15 NOTE — Care Management Important Message (Signed)
Important Message  Patient Details  Name: Russell Harris MRN: [id number] Date of Birth: 1966-04-29   Medicare Important Message Given:  Yes     Shelda Altes 09/15/2019, 12:22 PM

## 2019-09-15 NOTE — Progress Notes (Signed)
[place] for heparin Indication: 3V CAD  Allergies  Allergen Reactions  . Ketorolac Hives  . Tramadol Hives    Patient Measurements: Height: 6' (182.9 cm) Weight: 133 lb 1.6 oz (60.4 kg) IBW/kg (Calculated) : 77.6 Heparin Dosing Weight: 61.5kg  Vital Signs: Temp: 97.7 F (36.5 C) (09/21 0503) Temp Source: Oral (09/21 0503) BP: 103/60 (09/21 0503) Pulse Rate: 58 (09/21 0503)  Labs: Recent Labs    09/13/19 0441  09/14/19 0355 09/14/19 1220 09/14/19 2023 09/15/19 0509  HGB 13.7  --  13.1  --   --  14.1  HCT 39.2  --  38.7*  --   --  40.0  PLT 174  --  161  --   --  162  HEPARINUNFRC 0.18*   < > 0.75* 0.80* 0.74* 0.59  CREATININE 1.04  --   --   --   --   --    < > = values in this interval not displayed.    Estimated Creatinine Clearance: 68.6 mL/min (by C-G formula based on SCr of 1.04 mg/dL).   Assessment: 34 yoM transferred from OSH with CP and concern for ACS after high risk stress test. S/p cath - 3V CAD plan CABG for Tuesday (9/22). Heparin restarted post sheath removal on 09/12/19.  Heparin level therapeutic at 0.59 s/p rate decrease  Goal of Therapy:  Heparin level 0.3-0.7 units/ml Monitor platelets by anticoagulation protocol: Yes   Plan:  Continue heparin gtt at 950 units/hr F/u 6 hour level to confirm  Bertis Ruddy, PharmD Clinical Pharmacist Please check AMION for all [place] numbers 09/15/2019 6:17 AM

## 2019-09-16 ENCOUNTER — Inpatient Hospital Stay (HOSPITAL_COMMUNITY): Payer: Medicare HMO | Admitting: Certified Registered Nurse Anesthetist

## 2019-09-16 ENCOUNTER — Inpatient Hospital Stay (HOSPITAL_COMMUNITY): Payer: Medicare HMO

## 2019-09-16 ENCOUNTER — Encounter (HOSPITAL_COMMUNITY)
Admission: AD | Disposition: A | Discharge status: Home / Self Care | Payer: Self-pay | Source: Other Acute Inpatient Hospital | Attending: Cardiothoracic Surgery

## 2019-09-16 DIAGNOSIS — Z951 Presence of aortocoronary bypass graft: Secondary | ICD-10-CM

## 2019-09-16 HISTORY — DX: Presence of aortocoronary bypass graft: Z95.1

## 2019-09-16 HISTORY — PX: TEE WITHOUT CARDIOVERSION: SHX5443

## 2019-09-16 HISTORY — PX: CORONARY ARTERY BYPASS GRAFT: SHX141

## 2019-09-16 LAB — CBC
HCT: 31.1 % — ABNORMAL LOW (ref 39.0–52.0)
HCT: 23.1 % — ABNORMAL LOW (ref 39.0–52.0)
HCT: 18.4 % — ABNORMAL LOW (ref 39.0–52.0)
Hemoglobin: 10.8 g/dL — ABNORMAL LOW (ref 13.0–17.0)
Hemoglobin: 8 g/dL — ABNORMAL LOW (ref 13.0–17.0)
Hemoglobin: 6.4 g/dL — CL (ref 13.0–17.0)
MCH: 33.4 pg (ref 26.0–34.0)
MCH: 33.9 pg (ref 26.0–34.0)
MCH: 33.9 pg (ref 26.0–34.0)
MCHC: 34.7 g/dL (ref 30.0–36.0)
MCHC: 34.6 g/dL (ref 30.0–36.0)
MCHC: 34.8 g/dL (ref 30.0–36.0)
MCV: 96.3 fL (ref 80.0–100.0)
MCV: 97.9 fL (ref 80.0–100.0)
MCV: 97.4 fL (ref 80.0–100.0)
Platelets: 123 10*3/uL — ABNORMAL LOW (ref 150–400)
Platelets: 111 10*3/uL — ABNORMAL LOW (ref 150–400)
Platelets: 80 10*3/uL — ABNORMAL LOW (ref 150–400)
RBC: 3.23 MIL/uL — ABNORMAL LOW (ref 4.22–5.81)
RBC: 2.36 MIL/uL — ABNORMAL LOW (ref 4.22–5.81)
RBC: 1.89 MIL/uL — ABNORMAL LOW (ref 4.22–5.81)
RDW: 12.8 % (ref 11.5–15.5)
RDW: 13 % (ref 11.5–15.5)
RDW: 12.8 % (ref 11.5–15.5)
WBC: 19.5 10*3/uL — ABNORMAL HIGH (ref 4.0–10.5)
WBC: 13 10*3/uL — ABNORMAL HIGH (ref 4.0–10.5)
WBC: 8 10*3/uL (ref 4.0–10.5)
nRBC: 0 % (ref 0.0–0.2)
nRBC: 0 % (ref 0.0–0.2)
nRBC: 0 % (ref 0.0–0.2)

## 2019-09-16 LAB — POCT I-STAT 7, (LYTES, BLD GAS, ICA,H+H)
Acid-Base Excess: 2 mmol/L (ref 0.0–2.0)
Acid-base deficit: 2 mmol/L (ref 0.0–2.0)
Acid-base deficit: 2 mmol/L (ref 0.0–2.0)
Acid-base deficit: 3 mmol/L — ABNORMAL HIGH (ref 0.0–2.0)
Acid-base deficit: 3 mmol/L — ABNORMAL HIGH (ref 0.0–2.0)
Bicarbonate: 28.2 mmol/L — ABNORMAL HIGH (ref 20.0–28.0)
Bicarbonate: 25.6 mmol/L (ref 20.0–28.0)
Bicarbonate: 24.2 mmol/L (ref 20.0–28.0)
Bicarbonate: 22.7 mmol/L (ref 20.0–28.0)
Bicarbonate: 22.2 mmol/L (ref 20.0–28.0)
Calcium, Ion: 1.02 mmol/L — ABNORMAL LOW (ref 1.15–1.40)
Calcium, Ion: 1.16 mmol/L (ref 1.15–1.40)
Calcium, Ion: 1.18 mmol/L (ref 1.15–1.40)
Calcium, Ion: 1.19 mmol/L (ref 1.15–1.40)
Calcium, Ion: 1.34 mmol/L (ref 1.15–1.40)
HCT: 26 % — ABNORMAL LOW (ref 39.0–52.0)
HCT: 28 % — ABNORMAL LOW (ref 39.0–52.0)
HCT: 31 % — ABNORMAL LOW (ref 39.0–52.0)
HCT: 21 % — ABNORMAL LOW (ref 39.0–52.0)
HCT: 19 % — ABNORMAL LOW (ref 39.0–52.0)
Hemoglobin: 8.8 g/dL — ABNORMAL LOW (ref 13.0–17.0)
Hemoglobin: 9.5 g/dL — ABNORMAL LOW (ref 13.0–17.0)
Hemoglobin: 10.5 g/dL — ABNORMAL LOW (ref 13.0–17.0)
Hemoglobin: 7.1 g/dL — ABNORMAL LOW (ref 13.0–17.0)
Hemoglobin: 6.5 g/dL — CL (ref 13.0–17.0)
O2 Saturation: 100 %
O2 Saturation: 100 %
O2 Saturation: 100 %
O2 Saturation: 94 %
O2 Saturation: 97 %
Patient temperature: 35.2
Patient temperature: 36.3
Patient temperature: 36.2
Potassium: 4 mmol/L (ref 3.5–5.1)
Potassium: 4.5 mmol/L (ref 3.5–5.1)
Potassium: 4.3 mmol/L (ref 3.5–5.1)
Potassium: 4.2 mmol/L (ref 3.5–5.1)
Potassium: 4.2 mmol/L (ref 3.5–5.1)
Sodium: 141 mmol/L (ref 135–145)
Sodium: 138 mmol/L (ref 135–145)
Sodium: 141 mmol/L (ref 135–145)
Sodium: 142 mmol/L (ref 135–145)
Sodium: 142 mmol/L (ref 135–145)
TCO2: 30 mmol/L (ref 22–32)
TCO2: 27 mmol/L (ref 22–32)
TCO2: 26 mmol/L (ref 22–32)
TCO2: 24 mmol/L (ref 22–32)
TCO2: 23 mmol/L (ref 22–32)
pCO2 arterial: 51.7 mmHg — ABNORMAL HIGH (ref 32.0–48.0)
pCO2 arterial: 55.4 mmHg — ABNORMAL HIGH (ref 32.0–48.0)
pCO2 arterial: 44.8 mmHg (ref 32.0–48.0)
pCO2 arterial: 44 mmHg (ref 32.0–48.0)
pCO2 arterial: 40.6 mmHg (ref 32.0–48.0)
pH, Arterial: 7.345 — ABNORMAL LOW (ref 7.350–7.450)
pH, Arterial: 7.273 — ABNORMAL LOW (ref 7.350–7.450)
pH, Arterial: 7.332 — ABNORMAL LOW (ref 7.350–7.450)
pH, Arterial: 7.318 — ABNORMAL LOW (ref 7.350–7.450)
pH, Arterial: 7.342 — ABNORMAL LOW (ref 7.350–7.450)
pO2, Arterial: 385 mmHg — ABNORMAL HIGH (ref 83.0–108.0)
pO2, Arterial: 211 mmHg — ABNORMAL HIGH (ref 83.0–108.0)
pO2, Arterial: 176 mmHg — ABNORMAL HIGH (ref 83.0–108.0)
pO2, Arterial: 76 mmHg — ABNORMAL LOW (ref 83.0–108.0)
pO2, Arterial: 89 mmHg (ref 83.0–108.0)

## 2019-09-16 LAB — POCT I-STAT, CHEM 8
BUN: 14 mg/dL (ref 6–20)
BUN: 11 mg/dL (ref 6–20)
BUN: 13 mg/dL (ref 6–20)
BUN: 13 mg/dL (ref 6–20)
BUN: 12 mg/dL (ref 6–20)
Calcium, Ion: 1.25 mmol/L (ref 1.15–1.40)
Calcium, Ion: 1.01 mmol/L — ABNORMAL LOW (ref 1.15–1.40)
Calcium, Ion: 1.28 mmol/L (ref 1.15–1.40)
Calcium, Ion: 1.09 mmol/L — ABNORMAL LOW (ref 1.15–1.40)
Calcium, Ion: 1.15 mmol/L (ref 1.15–1.40)
Chloride: 103 mmol/L (ref 98–111)
Chloride: 103 mmol/L (ref 98–111)
Chloride: 98 mmol/L (ref 98–111)
Chloride: 102 mmol/L (ref 98–111)
Chloride: 102 mmol/L (ref 98–111)
Creatinine, Ser: 0.7 mg/dL (ref 0.61–1.24)
Creatinine, Ser: 0.5 mg/dL — ABNORMAL LOW (ref 0.61–1.24)
Creatinine, Ser: 0.7 mg/dL (ref 0.61–1.24)
Creatinine, Ser: 0.7 mg/dL (ref 0.61–1.24)
Creatinine, Ser: 0.7 mg/dL (ref 0.61–1.24)
Glucose, Bld: 91 mg/dL (ref 70–99)
Glucose, Bld: 115 mg/dL — ABNORMAL HIGH (ref 70–99)
Glucose, Bld: 129 mg/dL — ABNORMAL HIGH (ref 70–99)
Glucose, Bld: 182 mg/dL — ABNORMAL HIGH (ref 70–99)
Glucose, Bld: 150 mg/dL — ABNORMAL HIGH (ref 70–99)
HCT: 37 % — ABNORMAL LOW (ref 39.0–52.0)
HCT: 31 % — ABNORMAL LOW (ref 39.0–52.0)
HCT: 35 % — ABNORMAL LOW (ref 39.0–52.0)
HCT: 27 % — ABNORMAL LOW (ref 39.0–52.0)
HCT: 30 % — ABNORMAL LOW (ref 39.0–52.0)
Hemoglobin: 12.6 g/dL — ABNORMAL LOW (ref 13.0–17.0)
Hemoglobin: 10.5 g/dL — ABNORMAL LOW (ref 13.0–17.0)
Hemoglobin: 11.9 g/dL — ABNORMAL LOW (ref 13.0–17.0)
Hemoglobin: 9.2 g/dL — ABNORMAL LOW (ref 13.0–17.0)
Hemoglobin: 10.2 g/dL — ABNORMAL LOW (ref 13.0–17.0)
Potassium: 3.7 mmol/L (ref 3.5–5.1)
Potassium: 4 mmol/L (ref 3.5–5.1)
Potassium: 4.1 mmol/L (ref 3.5–5.1)
Potassium: 5.4 mmol/L — ABNORMAL HIGH (ref 3.5–5.1)
Potassium: 4.6 mmol/L (ref 3.5–5.1)
Sodium: 140 mmol/L (ref 135–145)
Sodium: 139 mmol/L (ref 135–145)
Sodium: 140 mmol/L (ref 135–145)
Sodium: 136 mmol/L (ref 135–145)
Sodium: 138 mmol/L (ref 135–145)
TCO2: 25 mmol/L (ref 22–32)
TCO2: 26 mmol/L (ref 22–32)
TCO2: 26 mmol/L (ref 22–32)
TCO2: 25 mmol/L (ref 22–32)
TCO2: 24 mmol/L (ref 22–32)

## 2019-09-16 LAB — GLUCOSE, CAPILLARY
Glucose-Capillary: 89 mg/dL (ref 70–99)
Glucose-Capillary: 95 mg/dL (ref 70–99)
Glucose-Capillary: 48 mg/dL — ABNORMAL LOW (ref 70–99)
Glucose-Capillary: 167 mg/dL — ABNORMAL HIGH (ref 70–99)
Glucose-Capillary: 119 mg/dL — ABNORMAL HIGH (ref 70–99)
Glucose-Capillary: 119 mg/dL — ABNORMAL HIGH (ref 70–99)
Glucose-Capillary: 125 mg/dL — ABNORMAL HIGH (ref 70–99)
Glucose-Capillary: 98 mg/dL (ref 70–99)
Glucose-Capillary: 123 mg/dL — ABNORMAL HIGH (ref 70–99)

## 2019-09-16 LAB — BASIC METABOLIC PANEL
Anion gap: 5 (ref 5–15)
BUN: 9 mg/dL (ref 6–20)
CO2: 23 mmol/L (ref 22–32)
Calcium: 8.8 mg/dL — ABNORMAL LOW (ref 8.9–10.3)
Chloride: 109 mmol/L (ref 98–111)
Creatinine, Ser: 0.81 mg/dL (ref 0.61–1.24)
GFR calc Af Amer: >60 mL/min (ref >60)
GFR calc non Af Amer: >60 mL/min (ref >60)
Glucose, Bld: 117 mg/dL — ABNORMAL HIGH (ref 70–99)
Potassium: 4.1 mmol/L (ref 3.5–5.1)
Sodium: 137 mmol/L (ref 135–145)

## 2019-09-16 LAB — HEMOGLOBIN AND HEMATOCRIT, BLOOD
HCT: 25.5 % — ABNORMAL LOW (ref 39.0–52.0)
Hemoglobin: 8.8 g/dL — ABNORMAL LOW (ref 13.0–17.0)

## 2019-09-16 LAB — PLATELET COUNT: Platelets: 117 10*3/uL — ABNORMAL LOW (ref 150–400)

## 2019-09-16 LAB — ECHO INTRAOPERATIVE TEE
Height: 72 in
Weight: 2134.05 oz

## 2019-09-16 LAB — MAGNESIUM: Magnesium: 3 mg/dL — ABNORMAL HIGH (ref 1.7–2.4)

## 2019-09-16 LAB — PREPARE RBC (CROSSMATCH)

## 2019-09-16 LAB — APTT: aPTT: 36 seconds (ref 24–36)

## 2019-09-16 LAB — PROTIME-INR
INR: 1.3 — ABNORMAL HIGH (ref 0.8–1.2)
Prothrombin Time: 16.2 seconds — ABNORMAL HIGH (ref 11.4–15.2)

## 2019-09-16 SURGERY — CORONARY ARTERY BYPASS GRAFTING (CABG)
Anesthesia: General | Site: Chest

## 2019-09-16 MED ORDER — MAGNESIUM SULFATE 4 GM/100ML IV SOLN
4.0000 g | Freq: Once | INTRAVENOUS | Status: AC
  Administered 2019-09-16: 14:00:00 4 g via INTRAVENOUS
  Filled 2019-09-16: qty 100

## 2019-09-16 MED ORDER — LIDOCAINE 2% (20 MG/ML) 5 ML SYRINGE
INTRAMUSCULAR | Status: DC | PRN
  Administered 2019-09-16: 100 mg via INTRAVENOUS

## 2019-09-16 MED ORDER — SODIUM CHLORIDE 0.9 % IV SOLN
INTRAVENOUS | Status: DC | PRN
  Administered 2019-09-16: 20 ug/min via INTRAVENOUS

## 2019-09-16 MED ORDER — INSULIN REGULAR(HUMAN) IN NACL 100-0.9 UT/100ML-% IV SOLN
INTRAVENOUS | Status: DC
  Administered 2019-09-16: 15:00:00 1.1 [IU]/h via INTRAVENOUS

## 2019-09-16 MED ORDER — DEXMEDETOMIDINE HCL IN NACL 200 MCG/50ML IV SOLN
0.0000 ug/kg/h | INTRAVENOUS | Status: DC
  Administered 2019-09-16: 0.7 ug/kg/h via INTRAVENOUS
  Filled 2019-09-16: qty 50

## 2019-09-16 MED ORDER — POTASSIUM CHLORIDE 10 MEQ/50ML IV SOLN: 10.0000 meq | INTRAVENOUS | Status: AC

## 2019-09-16 MED ORDER — ACETAMINOPHEN 650 MG RE SUPP
650.0000 mg | Freq: Once | RECTAL | Status: AC
  Administered 2019-09-16: 15:00:00 650 mg via RECTAL

## 2019-09-16 MED ORDER — DOCUSATE SODIUM 100 MG PO CAPS
200.0000 mg | ORAL_CAPSULE | Freq: Every day | ORAL | Status: DC
  Administered 2019-09-17 – 2019-09-21 (×5): 200 mg via ORAL
  Filled 2019-09-16 (×5): qty 2

## 2019-09-16 MED ORDER — HEPARIN SODIUM (PORCINE) 1000 UNIT/ML IJ SOLN
INTRAMUSCULAR | Status: AC
  Filled 2019-09-16: qty 1

## 2019-09-16 MED ORDER — MORPHINE SULFATE (PF) 2 MG/ML IV SOLN
1.0000 mg | INTRAVENOUS | Status: DC | PRN
  Administered 2019-09-16 (×2): 4 mg via INTRAVENOUS
  Administered 2019-09-16 – 2019-09-17 (×3): 2 mg via INTRAVENOUS
  Filled 2019-09-16: qty 2
  Filled 2019-09-16: qty 1
  Filled 2019-09-16: qty 2
  Filled 2019-09-16 (×2): qty 1

## 2019-09-16 MED ORDER — MIDAZOLAM HCL 5 MG/5ML IJ SOLN
INTRAMUSCULAR | Status: DC | PRN
  Administered 2019-09-16 (×2): 1 mg via INTRAVENOUS

## 2019-09-16 MED ORDER — FUROSEMIDE 10 MG/ML IJ SOLN
20.0000 mg | Freq: Once | INTRAMUSCULAR | Status: AC
  Administered 2019-09-17: 20 mg via INTRAVENOUS
  Filled 2019-09-16: qty 2

## 2019-09-16 MED ORDER — EPHEDRINE 5 MG/ML INJ
INTRAVENOUS | Status: AC
  Filled 2019-09-16: qty 10

## 2019-09-16 MED ORDER — BISACODYL 10 MG RE SUPP: 10.0000 mg | Freq: Every day | RECTAL | Status: DC

## 2019-09-16 MED ORDER — SODIUM CHLORIDE 0.9 % IV SOLN
INTRAVENOUS | Status: DC
  Administered 2019-09-16: 15:00:00 via INTRAVENOUS

## 2019-09-16 MED ORDER — 0.9 % SODIUM CHLORIDE (POUR BTL) OPTIME
TOPICAL | Status: DC | PRN
  Administered 2019-09-16: 07:00:00 5000 mL

## 2019-09-16 MED ORDER — SODIUM CHLORIDE 0.9 % IV SOLN
1.5000 g | Freq: Two times a day (BID) | INTRAVENOUS | Status: AC
  Administered 2019-09-16 – 2019-09-18 (×4): 1.5 g via INTRAVENOUS
  Filled 2019-09-16 (×5): qty 1.5

## 2019-09-16 MED ORDER — FENTANYL CITRATE (PF) 250 MCG/5ML IJ SOLN
INTRAMUSCULAR | Status: DC | PRN
  Administered 2019-09-16 (×2): 100 ug via INTRAVENOUS
  Administered 2019-09-16: 50 ug via INTRAVENOUS
  Administered 2019-09-16: 100 ug via INTRAVENOUS
  Administered 2019-09-16: 50 ug via INTRAVENOUS
  Administered 2019-09-16: 100 ug via INTRAVENOUS
  Administered 2019-09-16: 50 ug via INTRAVENOUS
  Administered 2019-09-16: 150 ug via INTRAVENOUS

## 2019-09-16 MED ORDER — ACETAMINOPHEN 500 MG PO TABS
1000.0000 mg | ORAL_TABLET | Freq: Four times a day (QID) | ORAL | Status: DC
  Administered 2019-09-16 – 2019-09-21 (×18): 1000 mg via ORAL
  Filled 2019-09-16 (×18): qty 2

## 2019-09-16 MED ORDER — CHLORHEXIDINE GLUCONATE CLOTH 2 % EX PADS
6.0000 | MEDICATED_PAD | Freq: Every day | CUTANEOUS | Status: DC
  Administered 2019-09-16 – 2019-09-20 (×4): 6 via TOPICAL

## 2019-09-16 MED ORDER — SODIUM CHLORIDE 0.9% FLUSH: 3.0000 mL | INTRAVENOUS | Status: DC | PRN

## 2019-09-16 MED ORDER — HEMOSTATIC AGENTS (NO CHARGE) OPTIME
TOPICAL | Status: DC | PRN
  Administered 2019-09-16: 1 via TOPICAL

## 2019-09-16 MED ORDER — DEXTROSE 50 % IV SOLN
21.0000 mL | Freq: Once | INTRAVENOUS | Status: AC
  Administered 2019-09-16: 15:00:00 21 mL via INTRAVENOUS

## 2019-09-16 MED ORDER — LACTATED RINGERS IV SOLN: 500.0000 mL | Freq: Once | INTRAVENOUS | Status: DC | PRN

## 2019-09-16 MED ORDER — NITROGLYCERIN IN D5W 200-5 MCG/ML-% IV SOLN: 7.0000 ug/min | INTRAVENOUS | Status: DC

## 2019-09-16 MED ORDER — ORAL CARE MOUTH RINSE
15.0000 mL | OROMUCOSAL | Status: DC
  Administered 2019-09-16 – 2019-09-17 (×7): 15 mL via OROMUCOSAL

## 2019-09-16 MED ORDER — LIDOCAINE 2% (20 MG/ML) 5 ML SYRINGE
INTRAMUSCULAR | Status: AC
  Filled 2019-09-16: qty 5

## 2019-09-16 MED ORDER — CHLORHEXIDINE GLUCONATE 0.12 % MT SOLN
15.0000 mL | OROMUCOSAL | Status: AC
  Administered 2019-09-16: 15 mL via OROMUCOSAL

## 2019-09-16 MED ORDER — PANTOPRAZOLE SODIUM 40 MG PO TBEC
40.0000 mg | DELAYED_RELEASE_TABLET | Freq: Every day | ORAL | Status: DC
  Administered 2019-09-18 – 2019-09-21 (×4): 40 mg via ORAL
  Filled 2019-09-16 (×4): qty 1

## 2019-09-16 MED ORDER — DEXTROSE 50 % IV SOLN
INTRAVENOUS | Status: AC
  Administered 2019-09-16: 21 mL via INTRAVENOUS
  Filled 2019-09-16: qty 50

## 2019-09-16 MED ORDER — OXYCODONE HCL 5 MG PO TABS
5.0000 mg | ORAL_TABLET | ORAL | Status: DC | PRN
  Administered 2019-09-16: 10 mg via ORAL
  Administered 2019-09-17: 10:00:00 5 mg via ORAL
  Administered 2019-09-17 – 2019-09-19 (×6): 10 mg via ORAL
  Filled 2019-09-16 (×4): qty 2
  Filled 2019-09-16: qty 1
  Filled 2019-09-16 (×3): qty 2

## 2019-09-16 MED ORDER — ASPIRIN 81 MG PO CHEW: 324.0000 mg | CHEWABLE_TABLET | Freq: Every day | ORAL | Status: DC

## 2019-09-16 MED ORDER — PROTAMINE SULFATE 10 MG/ML IV SOLN
INTRAVENOUS | Status: AC
  Filled 2019-09-16: qty 25

## 2019-09-16 MED ORDER — ALBUMIN HUMAN 5 % IV SOLN: 25.0000 g | Freq: Once | INTRAVENOUS | Status: DC

## 2019-09-16 MED ORDER — ROCURONIUM BROMIDE 10 MG/ML (PF) SYRINGE
PREFILLED_SYRINGE | INTRAVENOUS | Status: DC | PRN
  Administered 2019-09-16 (×2): 50 mg via INTRAVENOUS
  Administered 2019-09-16: 100 mg via INTRAVENOUS
  Administered 2019-09-16 (×2): 50 mg via INTRAVENOUS

## 2019-09-16 MED ORDER — PHENYLEPHRINE HCL (PRESSORS) 10 MG/ML IV SOLN
INTRAVENOUS | Status: AC
  Filled 2019-09-16: qty 2

## 2019-09-16 MED ORDER — SODIUM CHLORIDE 0.9 % IV SOLN
INTRAVENOUS | Status: DC | PRN
  Administered 2019-09-16: .6 [IU]/h via INTRAVENOUS

## 2019-09-16 MED ORDER — SODIUM CHLORIDE (PF) 0.9 % IJ SOLN
OROMUCOSAL | Status: DC | PRN
  Administered 2019-09-16 (×3): 4 mL via TOPICAL

## 2019-09-16 MED ORDER — PROTAMINE SULFATE 10 MG/ML IV SOLN
INTRAVENOUS | Status: DC | PRN
  Administered 2019-09-16 (×2): 60 mg via INTRAVENOUS
  Administered 2019-09-16 (×2): 50 mg via INTRAVENOUS

## 2019-09-16 MED ORDER — PROPOFOL 10 MG/ML IV BOLUS
INTRAVENOUS | Status: AC
  Filled 2019-09-16: qty 20

## 2019-09-16 MED ORDER — LACTATED RINGERS IV SOLN
INTRAVENOUS | Status: DC | PRN
  Administered 2019-09-16 (×3): via INTRAVENOUS

## 2019-09-16 MED ORDER — FENTANYL CITRATE (PF) 250 MCG/5ML IJ SOLN
INTRAMUSCULAR | Status: AC
  Filled 2019-09-16: qty 25

## 2019-09-16 MED ORDER — INSULIN REGULAR BOLUS VIA INFUSION
0.0000 [IU] | Freq: Three times a day (TID) | INTRAVENOUS | Status: DC
  Filled 2019-09-16: qty 10

## 2019-09-16 MED ORDER — HEPARIN SODIUM (PORCINE) 1000 UNIT/ML IJ SOLN
INTRAMUSCULAR | Status: DC | PRN
  Administered 2019-09-16: 22000 [IU] via INTRAVENOUS

## 2019-09-16 MED ORDER — SODIUM CHLORIDE 0.9 % IV SOLN: 1.0000 g | Freq: Once | INTRAVENOUS | Status: DC

## 2019-09-16 MED ORDER — DEXMEDETOMIDINE HCL IN NACL 400 MCG/100ML IV SOLN
INTRAVENOUS | Status: DC | PRN
  Administered 2019-09-16: .5 ug/kg/h via INTRAVENOUS

## 2019-09-16 MED ORDER — EPHEDRINE SULFATE 50 MG/ML IJ SOLN
INTRAMUSCULAR | Status: DC | PRN
  Administered 2019-09-16 (×3): 5 mg via INTRAVENOUS

## 2019-09-16 MED ORDER — SODIUM CHLORIDE 0.9 % IV SOLN: 250.0000 mL | INTRAVENOUS | Status: DC

## 2019-09-16 MED ORDER — LACTATED RINGERS IV SOLN
INTRAVENOUS | Status: DC | PRN
  Administered 2019-09-16: 08:00:00 via INTRAVENOUS

## 2019-09-16 MED ORDER — ROCURONIUM BROMIDE 10 MG/ML (PF) SYRINGE
PREFILLED_SYRINGE | INTRAVENOUS | Status: AC
  Filled 2019-09-16: qty 20

## 2019-09-16 MED ORDER — BISACODYL 5 MG PO TBEC
10.0000 mg | DELAYED_RELEASE_TABLET | Freq: Every day | ORAL | Status: DC
  Administered 2019-09-17 – 2019-09-19 (×3): 10 mg via ORAL
  Filled 2019-09-16 (×3): qty 2

## 2019-09-16 MED ORDER — STERILE WATER FOR INJECTION IJ SOLN
INTRAMUSCULAR | Status: AC
  Filled 2019-09-16: qty 10

## 2019-09-16 MED ORDER — PROPOFOL 10 MG/ML IV BOLUS
INTRAVENOUS | Status: DC | PRN
  Administered 2019-09-16: 30 mg via INTRAVENOUS
  Administered 2019-09-16: 100 mg via INTRAVENOUS

## 2019-09-16 MED ORDER — ACETAMINOPHEN 160 MG/5ML PO SOLN: 1000.0000 mg | Freq: Four times a day (QID) | ORAL | Status: DC

## 2019-09-16 MED ORDER — FAMOTIDINE IN NACL 20-0.9 MG/50ML-% IV SOLN
20.0000 mg | Freq: Two times a day (BID) | INTRAVENOUS | Status: AC
  Administered 2019-09-16: 20 mg via INTRAVENOUS

## 2019-09-16 MED ORDER — NITROGLYCERIN IN D5W 200-5 MCG/ML-% IV SOLN
INTRAVENOUS | Status: DC | PRN
  Administered 2019-09-16: 10 ug/min via INTRAVENOUS

## 2019-09-16 MED ORDER — LACTATED RINGERS IV SOLN
INTRAVENOUS | Status: DC
  Administered 2019-09-16: 15:00:00 via INTRAVENOUS
  Administered 2019-09-17: 20 mL/h via INTRAVENOUS

## 2019-09-16 MED ORDER — ALBUMIN HUMAN 5 % IV SOLN
250.0000 mL | INTRAVENOUS | Status: AC | PRN
  Administered 2019-09-16: 19:00:00 12.5 g via INTRAVENOUS

## 2019-09-16 MED ORDER — ALBUMIN HUMAN 5 % IV SOLN
250.0000 mL | INTRAVENOUS | Status: AC | PRN
  Administered 2019-09-16 (×4): 12.5 g via INTRAVENOUS
  Filled 2019-09-16: qty 500

## 2019-09-16 MED ORDER — ASPIRIN EC 325 MG PO TBEC
325.0000 mg | DELAYED_RELEASE_TABLET | Freq: Every day | ORAL | Status: DC
  Administered 2019-09-17: 10:00:00 325 mg via ORAL
  Filled 2019-09-16: qty 1

## 2019-09-16 MED ORDER — CHLORHEXIDINE GLUCONATE 0.12% ORAL RINSE (MEDLINE KIT)
15.0000 mL | Freq: Two times a day (BID) | OROMUCOSAL | Status: DC
  Administered 2019-09-16 – 2019-09-19 (×4): 15 mL via OROMUCOSAL

## 2019-09-16 MED ORDER — ACETAMINOPHEN 160 MG/5ML PO SOLN: 650.0000 mg | Freq: Once | ORAL | Status: AC

## 2019-09-16 MED ORDER — METOPROLOL TARTRATE 25 MG/10 ML ORAL SUSPENSION: 12.5000 mg | Freq: Two times a day (BID) | ORAL | Status: DC

## 2019-09-16 MED ORDER — INDOCYANINE GREEN 25 MG IV SOLR
INTRAVENOUS | Status: DC | PRN
  Administered 2019-09-16: 11:00:00 25 mg via OPHTHALMIC

## 2019-09-16 MED ORDER — NITROGLYCERIN IN D5W 200-5 MCG/ML-% IV SOLN
0.0000 ug/min | INTRAVENOUS | Status: DC
  Administered 2019-09-16: 7 ug/min via INTRAVENOUS

## 2019-09-16 MED ORDER — LACTATED RINGERS IV SOLN: INTRAVENOUS | Status: DC

## 2019-09-16 MED ORDER — VANCOMYCIN HCL IN DEXTROSE 1-5 GM/200ML-% IV SOLN
1000.0000 mg | Freq: Once | INTRAVENOUS | Status: AC
  Administered 2019-09-16: 1000 mg via INTRAVENOUS
  Filled 2019-09-16: qty 200

## 2019-09-16 MED ORDER — METOPROLOL TARTRATE 12.5 MG HALF TABLET
12.5000 mg | ORAL_TABLET | Freq: Two times a day (BID) | ORAL | Status: DC
  Administered 2019-09-17 – 2019-09-18 (×4): 12.5 mg via ORAL
  Filled 2019-09-16 (×5): qty 1

## 2019-09-16 MED ORDER — ALBUMIN HUMAN 5 % IV SOLN
INTRAVENOUS | Status: AC
  Administered 2019-09-16: 12.5 g via INTRAVENOUS
  Filled 2019-09-16: qty 500

## 2019-09-16 MED ORDER — STERILE WATER FOR INJECTION IJ SOLN
INTRAMUSCULAR | Status: DC | PRN
  Administered 2019-09-16: 1000 mL

## 2019-09-16 MED ORDER — MIDAZOLAM HCL 2 MG/2ML IJ SOLN: 2.0000 mg | INTRAMUSCULAR | Status: DC | PRN

## 2019-09-16 MED ORDER — PLASMA-LYTE 148 IV SOLN
INTRAVENOUS | Status: DC | PRN
  Administered 2019-09-16: 500 mL via INTRAVASCULAR

## 2019-09-16 MED ORDER — SODIUM CHLORIDE 0.9% IV SOLUTION
Freq: Once | INTRAVENOUS | Status: AC
  Administered 2019-09-17: via INTRAVENOUS

## 2019-09-16 MED ORDER — SODIUM CHLORIDE 0.9 % IV SOLN
INTRAVENOUS | Status: DC | PRN
  Administered 2019-09-16: 750 mg via INTRAVENOUS

## 2019-09-16 MED ORDER — MIDAZOLAM HCL (PF) 10 MG/2ML IJ SOLN
INTRAMUSCULAR | Status: AC
  Filled 2019-09-16: qty 2

## 2019-09-16 MED ORDER — SODIUM CHLORIDE 0.9% FLUSH
3.0000 mL | Freq: Two times a day (BID) | INTRAVENOUS | Status: DC
  Administered 2019-09-17 – 2019-09-20 (×8): 3 mL via INTRAVENOUS

## 2019-09-16 MED ORDER — ONDANSETRON HCL 4 MG/2ML IJ SOLN
4.0000 mg | Freq: Four times a day (QID) | INTRAMUSCULAR | Status: DC | PRN
  Administered 2019-09-17: 4 mg via INTRAVENOUS
  Filled 2019-09-16: qty 2

## 2019-09-16 MED ORDER — SODIUM CHLORIDE 0.45 % IV SOLN
INTRAVENOUS | Status: DC | PRN
  Administered 2019-09-16: 14:00:00 via INTRAVENOUS

## 2019-09-16 MED ORDER — METOPROLOL TARTRATE 5 MG/5ML IV SOLN: 2.5000 mg | INTRAVENOUS | Status: DC | PRN

## 2019-09-16 MED ORDER — CALCIUM CHLORIDE 10 % IV SOLN
1.0000 g | Freq: Once | INTRAVENOUS | Status: AC
  Administered 2019-09-16: 1 g via INTRAVENOUS

## 2019-09-16 MED ORDER — ROCURONIUM BROMIDE 10 MG/ML (PF) SYRINGE
PREFILLED_SYRINGE | INTRAVENOUS | Status: AC
  Filled 2019-09-16: qty 10

## 2019-09-16 MED ORDER — PHENYLEPHRINE 40 MCG/ML (10ML) SYRINGE FOR IV PUSH (FOR BLOOD PRESSURE SUPPORT)
PREFILLED_SYRINGE | INTRAVENOUS | Status: DC | PRN
  Administered 2019-09-16 (×2): 40 ug via INTRAVENOUS
  Administered 2019-09-16: 80 ug via INTRAVENOUS

## 2019-09-16 MED ORDER — INDOCYANINE GREEN 25 MG IV SOLR
INTRAVENOUS | Status: DC | PRN
  Administered 2019-09-16: 1.25 mg via INTRAVENOUS

## 2019-09-16 MED ORDER — ISOSORBIDE MONONITRATE ER 30 MG PO TB24
30.0000 mg | ORAL_TABLET | Freq: Every day | ORAL | Status: DC
  Administered 2019-09-17 – 2019-09-19 (×3): 30 mg via ORAL
  Filled 2019-09-16 (×3): qty 1

## 2019-09-16 MED ORDER — PHENYLEPHRINE HCL-NACL 20-0.9 MG/250ML-% IV SOLN
0.0000 ug/min | INTRAVENOUS | Status: DC
  Administered 2019-09-16 (×2): 70 ug/min via INTRAVENOUS
  Administered 2019-09-17: 03:00:00 60 ug/min via INTRAVENOUS
  Filled 2019-09-16 (×3): qty 250

## 2019-09-16 MED ORDER — TRANEXAMIC ACID 1000 MG/10ML IV SOLN
INTRAVENOUS | Status: DC | PRN
  Administered 2019-09-16: 1.5 mg/kg/h via INTRAVENOUS

## 2019-09-16 SURGICAL SUPPLY — 103 items
ADAPTER CARDIO PERF ANTE/RETRO (ADAPTER) ×3 IMPLANT
ADH SKN CLS APL DERMABOND .7 (GAUZE/BANDAGES/DRESSINGS) ×4
ADPR PRFSN 84XANTGRD RTRGD (ADAPTER) ×2
BAG DECANTER FOR FLEXI CONT (MISCELLANEOUS) ×4 IMPLANT
BASKET HEART (ORDER IN 25'S) (MISCELLANEOUS) ×1
BASKET HEART (ORDER IN 25S) (MISCELLANEOUS) ×2 IMPLANT
BATTERY MAXDRIVER (MISCELLANEOUS) ×1 IMPLANT
BLADE CLIPPER SURG (BLADE) ×3 IMPLANT
BLADE STERNUM SYSTEM 6 (BLADE) ×3 IMPLANT
BNDG ELASTIC 4X5.8 VLCR STR LF (GAUZE/BANDAGES/DRESSINGS) ×3 IMPLANT
BNDG ELASTIC 6X5.8 VLCR STR LF (GAUZE/BANDAGES/DRESSINGS) ×2 IMPLANT
BNDG GAUZE ELAST 4 BULKY (GAUZE/BANDAGES/DRESSINGS) ×3 IMPLANT
CANISTER SUCT 3000ML PPV (MISCELLANEOUS) ×3 IMPLANT
CATH CPB KIT HENDRICKSON (MISCELLANEOUS) ×3 IMPLANT
CATH ROBINSON RED A/P 18FR (CATHETERS) ×6 IMPLANT
CLIP RETRACTION 3.0MM CORONARY (MISCELLANEOUS) ×3 IMPLANT
CLIP VESOCCLUDE SM WIDE 24/CT (CLIP) ×1 IMPLANT
CONN ST 1/4X3/8  BEN (MISCELLANEOUS) ×2
CONN ST 1/4X3/8 BEN (MISCELLANEOUS) IMPLANT
COVER WAND RF STERILE (DRAPES) ×2 IMPLANT
DERMABOND ADVANCED (GAUZE/BANDAGES/DRESSINGS) ×2
DERMABOND ADVANCED .7 DNX12 (GAUZE/BANDAGES/DRESSINGS) ×2 IMPLANT
DRAIN CHANNEL 28F RND 3/8 FF (WOUND CARE) ×10 IMPLANT
DRAPE CARDIOVASCULAR INCISE (DRAPES) ×3
DRAPE IMP U-DRAPE 54X76 (DRAPES) ×1 IMPLANT
DRAPE SLUSH/WARMER DISC (DRAPES) ×3 IMPLANT
DRAPE SRG 135X102X78XABS (DRAPES) ×2 IMPLANT
DRSG AQUACEL AG ADV 3.5X14 (GAUZE/BANDAGES/DRESSINGS) ×3 IMPLANT
ELECT CAUTERY BLADE 6.4 (BLADE) ×4 IMPLANT
ELECT REM PT RETURN 9FT ADLT (ELECTROSURGICAL) ×6
ELECTRODE REM PT RTRN 9FT ADLT (ELECTROSURGICAL) ×4 IMPLANT
FELT TEFLON 1X6 (MISCELLANEOUS) ×5 IMPLANT
GAUZE SPONGE 4X4 12PLY STRL (GAUZE/BANDAGES/DRESSINGS) ×6 IMPLANT
GLOVE BIO SURGEON STRL SZ 6.5 (GLOVE) ×2 IMPLANT
GLOVE BIO SURGEON STRL SZ8.5 (GLOVE) ×1 IMPLANT
GLOVE BIOGEL PI IND STRL 6 (GLOVE) IMPLANT
GLOVE BIOGEL PI IND STRL 6.5 (GLOVE) IMPLANT
GLOVE BIOGEL PI INDICATOR 6 (GLOVE) ×1
GLOVE BIOGEL PI INDICATOR 6.5 (GLOVE) ×3
GLOVE INDICATOR 6.5 STRL GRN (GLOVE) ×2 IMPLANT
GLOVE INDICATOR 7.5 STRL GRN (GLOVE) ×1 IMPLANT
GLOVE NEODERM STRL 7.5 LF PF (GLOVE) ×6 IMPLANT
GLOVE SURG NEODERM 7.5  LF PF (GLOVE) ×3
GLOVE SURG SS PI 7.5 STRL IVOR (GLOVE) ×2 IMPLANT
GOWN STRL REUS W/ TWL LRG LVL3 (GOWN DISPOSABLE) ×8 IMPLANT
GOWN STRL REUS W/TWL LRG LVL3 (GOWN DISPOSABLE) ×21
HEMOSTAT POWDER SURGIFOAM 1G (HEMOSTASIS) ×9 IMPLANT
HEMOSTAT SURGICEL 2X14 (HEMOSTASIS) ×3 IMPLANT
KIT BASIN OR (CUSTOM PROCEDURE TRAY) ×3 IMPLANT
KIT SUCTION CATH 14FR (SUCTIONS) ×3 IMPLANT
KIT TURNOVER KIT B (KITS) ×3 IMPLANT
KIT VASOVIEW HEMOPRO 2 VH 4000 (KITS) ×2 IMPLANT
LEAD PACING MYOCARDI (MISCELLANEOUS) ×3 IMPLANT
MARKER GRAFT CORONARY BYPASS (MISCELLANEOUS) ×9 IMPLANT
NS IRRIG 1000ML POUR BTL (IV SOLUTION) ×15 IMPLANT
PACK E OPEN HEART (SUTURE) ×3 IMPLANT
PACK OPEN HEART (CUSTOM PROCEDURE TRAY) ×3 IMPLANT
PACK SPY-PHI (KITS) IMPLANT
PAD ARMBOARD 7.5X6 YLW CONV (MISCELLANEOUS) ×5 IMPLANT
PAD ELECT DEFIB RADIOL ZOLL (MISCELLANEOUS) ×3 IMPLANT
PENCIL BUTTON HOLSTER BLD 10FT (ELECTRODE) ×3 IMPLANT
PLATE STERNAL 2.3X208 14H 2-PK (Plate) ×1 IMPLANT
POSITIONER HEAD DONUT 9IN (MISCELLANEOUS) ×3 IMPLANT
POWDER SURGICEL 3.0 GRAM (HEMOSTASIS) ×1 IMPLANT
PUNCH AORTIC ROTATE 4.0MM (MISCELLANEOUS) ×1 IMPLANT
SCREW LOCKING TI 2.3X11MM (Screw) ×6 IMPLANT
SCREW LOCKING TI 2.3X13MM (Screw) ×4 IMPLANT
SCREW STERNAL LOCK 2.3MM (Screw) ×2 IMPLANT
SEALANT SURG COSEAL 4ML (VASCULAR PRODUCTS) ×1 IMPLANT
SET CARDIOPLEGIA MPS 5001102 (MISCELLANEOUS) ×1 IMPLANT
SHEARS HARMONIC 9CM CVD (BLADE) ×1 IMPLANT
SPONGE LAP 18X18 RF (DISPOSABLE) ×1 IMPLANT
SUT BONE WAX W31G (SUTURE) ×3 IMPLANT
SUT MNCRL AB 3-0 PS2 18 (SUTURE) ×8 IMPLANT
SUT PDS AB 1 CTX 36 (SUTURE) ×6 IMPLANT
SUT PROLENE 3 0 SH DA (SUTURE) ×3 IMPLANT
SUT PROLENE 5 0 C 1 36 (SUTURE) IMPLANT
SUT PROLENE 6 0 C 1 30 (SUTURE) ×9 IMPLANT
SUT PROLENE 8 0 BV175 6 (SUTURE) IMPLANT
SUT PROLENE BLUE 7 0 (SUTURE) ×3 IMPLANT
SUT SILK  1 MH (SUTURE) ×1
SUT SILK 1 MH (SUTURE) IMPLANT
SUT SILK 2 0 SH CR/8 (SUTURE) IMPLANT
SUT SILK 3 0 SH CR/8 (SUTURE) IMPLANT
SUT STEEL 6MS V (SUTURE) ×3 IMPLANT
SUT STEEL SZ 6 DBL 3X14 BALL (SUTURE) ×3 IMPLANT
SUT VIC AB 2-0 CT1 27 (SUTURE) ×6
SUT VIC AB 2-0 CT1 TAPERPNT 27 (SUTURE) IMPLANT
SUT VIC AB 2-0 CTX 27 (SUTURE) IMPLANT
SUT VIC AB 3-0 X1 27 (SUTURE) IMPLANT
SYR 10ML LL (SYRINGE) IMPLANT
SYSTEM SAHARA CHEST DRAIN ATS (WOUND CARE) ×4 IMPLANT
TAPE CLOTH SURG 4X10 WHT LF (GAUZE/BANDAGES/DRESSINGS) ×1 IMPLANT
TAPE PAPER 2X10 WHT MICROPORE (GAUZE/BANDAGES/DRESSINGS) ×1 IMPLANT
TOWEL GREEN STERILE (TOWEL DISPOSABLE) ×3 IMPLANT
TOWEL GREEN STERILE FF (TOWEL DISPOSABLE) ×3 IMPLANT
TRAY FOLEY SLVR 16FR TEMP STAT (SET/KITS/TRAYS/PACK) ×3 IMPLANT
TUBE CONNECTING 20X1/4 (TUBING) ×1 IMPLANT
TUBING LAP HI FLOW INSUFFLATIO (TUBING) ×2 IMPLANT
UNDERPAD 30X30 (UNDERPADS AND DIAPERS) ×4 IMPLANT
WATER STERILE IRR 1000ML POUR (IV SOLUTION) ×6 IMPLANT
WATER STERILE IRR 1000ML UROMA (IV SOLUTION) ×1 IMPLANT
YANKAUER SUCT BULB TIP NO VENT (SUCTIONS) ×1 IMPLANT

## 2019-09-16 NOTE — Brief Op Note (Addendum)
09/11/2019 - 09/16/2019  12:37 PM  PATIENT:  Russell Harris  53 y.o. male  PRE-OPERATIVE DIAGNOSIS:  CAD  POST-OPERATIVE DIAGNOSIS:  CAD  PROCEDURE:  Procedure(s): CORONARY ARTERY BYPASS GRAFTING (CABG) x 3 WITH BILATERAL IMA'S AND RIGHT RADIAL ARTERY OPEN HARVESTING.   LIMA->LAD RIMA->OM1 as pedicled graft RRA->D1   TRANSESOPHAGEAL ECHOCARDIOGRAM (TEE) (N/A)  SURGEON:  Surgeon(s) and Role:    * Wonda Olds, MD - Primary    * Grace Isaac, MD - Assisting  PHYSICIAN ASSISTANT: Roddenberry  ANESTHESIA:   general  EBL:  Per anesthesia and perfusion records   BLOOD ADMINISTERED:Per anesthesia notes  DRAINS: Bilateral pleural tubes and mediastinal tube.  LOCAL MEDICATIONS USED:  NONE  SPECIMEN:  No Specimen  DISPOSITION OF SPECIMEN:  N/A  COUNTS:  YES  DICTATION: .Dragon Dictation  PLAN OF CARE: Admit to inpatient   PATIENT DISPOSITION:  ICU - intubated and hemodynamically stable.   Delay start of Pharmacological VTE agent (>24hrs) due to surgical blood loss or risk of bleeding: yes

## 2019-09-16 NOTE — Anesthesia Procedure Notes (Signed)
Arterial Line Insertion Start/End9/22/2020 6:56 AM, 09/16/2019 7:03 AM Performed by: Duane Boston, MD, anesthesiologist  Patient location: Pre-op. Preanesthetic checklist: patient identified, IV checked, site marked, risks and benefits discussed, surgical consent, monitors and equipment checked, pre-op evaluation, timeout performed and anesthesia consent Lidocaine 1% used for infiltration Left, brachial was placed Catheter size: 20 Fr Hand hygiene performed  and maximum sterile barriers used   Attempts: 1 Procedure performed using ultrasound guided technique. Ultrasound Notes:anatomy identified and image(s) printed for medical record Following insertion, dressing applied and Biopatch. Post procedure assessment: normal and unchanged  Patient tolerated the procedure well with no immediate complications.

## 2019-09-16 NOTE — Op Note (Signed)
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  Surgeon: B. Murvin Natal, MD  Assistant: Ceasar Mons, MD; Enid Cutter PA-C  Anesthesia: GET  Operative Findings:  good left ventricular systolic function  good quality internal mammary artery conduits  good quality radial conduit  Good quality target vessels for grafting    BRIEF CLINICAL NOTE AND INDICATIONS FOR SURGERY  53 yo man presented with rest chest pain. Found to have NSTEMI and severe 2V CAD. Presents for CABG   DETAILS OF THE OPERATIVE PROCEDURE  Preparation:  The patient is brought to the operating room on the above mentioned date and central monitoring was established by the anesthesia team including placement of Swan-Ganz catheter and radial arterial line. The patient is placed in the supine position on the operating table.  Intravenous antibiotics are administered. General endotracheal anesthesia is induced uneventfully. A Foley catheter is placed.  Baseline transesophageal echocardiogram was performed.  Findings were notable for preserved LV function  The patient's chest, abdomen, both groins, right upper extremity, and both lower extremities are prepared and draped in a sterile manner. A time out procedure is performed.   Surgical Approach and Conduit Harvest:  Attention is 1st turned to the right forearm where an incision is made overlying  a palpable radial pulse. The radial artery was harvested with an open approach, removing the artery's length just proximal to the prior puncture site to just before the brachial bifurcation. This was done with the harmonic scalpel. Once removed, the 2 ends of the artery were controlled with suture ligation. The wound was closed in layers. Then, median sternotomy incision was performed and the left internal mammary artery is dissected from the chest wall and prepared for bypass grafting. The left internal mammary artery is notably good quality conduit. When this is completed, attention was turned to the right chest wall where the right IMA is harvested by a skeletonized technique.   Following systemic heparinization, the left and right internal mammary arteries were transected distally noted to have excellent flow.   Extracorporeal Cardiopulmonary Bypass and Myocardial Protection:  The pericardium is opened. The ascending aorta is normal in appearance. The ascending aorta and the right atrium are cannulated for cardiopulmonary bypass.  Adequate heparinization is verified.   The entire pre-bypass portion of the operation was notable for stable hemodynamics.  Cardiopulmonary bypass was begun and the surface of the heart is inspected. Distal target vessels are selected for coronary artery bypass grafting. A cardioplegia cannula is placed in the ascending aorta.  The patient is allowed to cool passively to 34C systemic temperature.  The aortic cross clamp is applied and cold blood cardioplegia is delivered initially in an antegrade fashion through the aortic root.   Iced saline slush is applied for topical hypothermia.  The initial cardioplegic arrest is rapid with early diastolic arrest.  Repeat doses of cardioplegia are administered intermittently throughout the entire cross clamp portion of the operation through the aortic root, and through subsequently placed grafts in order to maintain completely flat  electrocardiogram.  Myocardial protection  was felt to be  excellent.  Coronary Artery Bypass Grafting:   The  diagonal branch of the left anterior descending coronary artery was grafted using the right radial artery graft in an end-to-side fashion.  At the site of distal anastomosis the target vessel was fair quality and measured approximately 1.5  mm in diameter. Anastomotic patency and runoff was confirmed with indocyanine green fluorescence imaging (SPY).  The  obtuse marginal branch of the left circumflex coronary artery was grafted using the pedicled RIMA graft which is brought underneath the aorta through the transverse sinus in an end-to-side fashion.  At the site of distal anastomosis the target vessel was good quality and measured approximately 1.5 mm in diameter. Anastomotic patency and runoff was confirmed with indocyanine green fluorescence imaging (SPY).  The distal left anterior coronary artery was grafted with the left internal mammary artery in an end-to-side fashion.  At the site of distal anastomosis the target vessel was good quality and measured approximately 1.46mm in diameter. Anastomotic patency and runoff was confirmed with indocyanine green fluorescence imaging (SPY).  The proximal radial artery graft anastomosis were placed directly to the ascending aorta prior to removal of the aortic cross clamp with running 6-0 prolene. Anastomotic patency and runoff was confirmed with indocyanine green fluorescence imaging (SPY). The aortic cross clamp was removed after a total cross clamp time of 72 minutes.   Procedure Completion:  All proximal and distal coronary anastomoses were inspected for hemostasis and appropriate graft orientation. Epicardial pacing wires are fixed to the right ventricular outflow tract and to the right atrial appendage. The patient is rewarmed to 37C temperature. The patient is weaned and disconnected from cardiopulmonary bypass.  The patient's rhythm at  separation from bypass was NSR.  The patient was weaned from cardiopulmonary bypass without any inotropic support. Total cardiopulmonary bypass time for the operation was 99 minutes.  Followup transesophageal echocardiogram performed after separation from bypass revealed no changes from the preoperative exam.  The aortic and venous cannula were removed uneventfully. Protamine was administered to reverse the anticoagulation. The mediastinum and pleural space were inspected for hemostasis and irrigated with saline solution. The mediastinum and bilateral pleural spaces were drained using fluted chest tubes placed through separate stab incisions inferiorly.  The soft tissues anterior to the aorta were reapproximated loosely. The sternum is supported with rigid linear bars on each side, then closed with double strength sternal wire. The soft tissues anterior to the sternum were closed in multiple layers and the skin is closed with a running subcuticular skin closure.  The post-bypass portion of the operation was notable for stable rhythm and hemodynamics.  No blood products were administered during the operation.   Disposition:  The patient tolerated the procedure well and is transported to the surgical intensive care in stable condition. There are no intraoperative complications. All sponge instrument and needle counts are verified correct at completion of the operation.   Jayme Cloud, MD 09/16/2019 2:09 PM

## 2019-09-16 NOTE — Procedures (Signed)
Extubation Procedure Note  Patient Details:   Name: Russell Harris DOB: Sep 30, 1966 MRN: [id number]   Airway Documentation:   Patient extubated per protocol, placed on 4 lpm humidified oxygen. Patient had positive cuff leak prior to extubation. Pt VC 1.2L and NIF -25. Patient has strong cough and able to voice.  Vent end date: 09/16/19 Vent end time: 1810   Evaluation  O2 sats: stable throughout Complications: No apparent complications Patient did tolerate procedure well. Bilateral Breath Sounds: Clear, Diminished   Yes  Ander Purpura 09/16/2019, 6:12 PM

## 2019-09-16 NOTE — Progress Notes (Signed)
RT NOTES: Rapid wean protocol initiated 

## 2019-09-16 NOTE — Anesthesia Procedure Notes (Signed)
Central Venous Catheter Insertion Performed by: Duane Boston, MD, anesthesiologist Start/End9/22/2020 6:46 AM, 09/16/2019 6:56 AM Patient location: Pre-op. Preanesthetic checklist: patient identified, IV checked, site marked, risks and benefits discussed, surgical consent, monitors and equipment checked, pre-op evaluation, timeout performed and anesthesia consent Position: Trendelenburg Lidocaine 1% used for infiltration and patient sedated Hand hygiene performed , maximum sterile barriers used  and Seldinger technique used Catheter size: 8.5 Fr Total catheter length 8. PA cath was placed.Sheath introducer Swan type:thermodilution PA Cath depth:50 Procedure performed using ultrasound guided technique. Ultrasound Notes:anatomy identified, needle tip was noted to be adjacent to the nerve/plexus identified, no ultrasound evidence of intravascular and/or intraneural injection and image(s) printed for medical record Attempts: 1 Following insertion, line sutured and dressing applied. Post procedure assessment: free fluid flow, blood return through all ports and no air  Patient tolerated the procedure well with no immediate complications.

## 2019-09-16 NOTE — Anesthesia Procedure Notes (Addendum)
Procedure Name: Intubation Date/Time: 09/16/2019 7:41 AM Performed by: Milford Cage, CRNA Pre-anesthesia Checklist: Patient identified, Emergency Drugs available, Suction available and Patient being monitored Patient Re-evaluated:Patient Re-evaluated prior to induction Oxygen Delivery Method: Circle System Utilized Preoxygenation: Pre-oxygenation with 100% oxygen Induction Type: IV induction Ventilation: Mask ventilation without difficulty and Oral airway inserted - appropriate to patient size Laryngoscope Size: Miller and 3 Grade View: Grade I Tube type: Oral Tube size: 8.0 mm Number of attempts: 1 Airway Equipment and Method: Stylet and Oral airway Placement Confirmation: ETT inserted through vocal cords under direct vision,  positive ETCO2 and breath sounds checked- equal and bilateral Secured at: 22 cm Tube secured with: Tape Dental Injury: Teeth and Oropharynx as per pre-operative assessment

## 2019-09-16 NOTE — Progress Notes (Signed)
CT surgery p.m. Rounds  Patient extubated after multivessel CABG earlier today While during bath patient turned and 220 cc of chest tube output occurred without change in vital signs. Follow-up hemoglobin 8.0. Minimal chest tube output since the event We will transfuse 1 FFP and observe with repeat CBC in 4 hours.

## 2019-09-16 NOTE — Discharge Instructions (Signed)

## 2019-09-16 NOTE — OR Nursing (Signed)
[place]

## 2019-09-16 NOTE — Anesthesia Postprocedure Evaluation (Signed)
Anesthesia Post Note  Patient: DAEMYN PELAGIO  Procedure(s) Performed: CORONARY ARTERY BYPASS GRAFTING (CABG) x 3 WITH BILATERAL IMA'S AND RIGHT RADIAL ARTERY OPEN HARVESTING. (N/A Chest) TRANSESOPHAGEAL ECHOCARDIOGRAM (TEE) (N/A )     Patient location during evaluation: SICU Anesthesia Type: General Level of consciousness: sedated Pain management: pain level controlled Vital Signs Assessment: post-procedure vital signs reviewed and stable Respiratory status: patient remains intubated per anesthesia plan Cardiovascular status: stable Postop Assessment: no apparent nausea or vomiting Anesthetic complications: no    Last Vitals:  Vitals:   09/16/19 1515 09/16/19 1530  BP:    Pulse: 80 80  Resp: 12 12  Temp: (!) 35.8 C (!) 36 C  SpO2: 100% 100%    Last Pain:  Vitals:   09/16/19 0503  TempSrc: Oral  PainSc:                  Miette Molenda DANIEL

## 2019-09-16 NOTE — Discharge Summary (Signed)
Physician Discharge Summary  Patient ID: YANN LORENTZEN MRN: [id number] DOB/AGE: [birth year] 53 y.o.  Admit date: 09/11/2019 Discharge date: 09/26/2019  Admission Diagnoses: Multi-vessel coronary artery disease Angina pectoris Hype rtension Chronic obstructive pulmonary disease Tobacco abuse Hodgkin Lymphoma s/p Chemotherapy treatment in 1997  Discharge Diagnoses:  Multi-vessel coronary artery disease S/P CABG x 3 Angina pectoris Hypertension Chronic obstructive pulmonary disease Tobacco abuse Hodgkin Lymphoma s/p Chemotherapy treatment in 1997  Discharged Condition: stable   History of Present Illness:       Mr. Jasan Potempa is a 53 year old male with a past medical history significant for tobacco abuse, hypertension, Hodgkin Lymphoma s/p Chemotherapy treatment in 1997, chronic back pain, and COPD who was admitted after having a positive nuclear medicine perfusion imaging study concerning for anterior wall ischemia. He was having what he thought was indigestion on Tuesday night which continued into Wednesday. On arrival  his EKG on arrival showed normal sinus rhythm in the 60s with a an incomplete right bundle branch block with possible left anterior fascicular block and questionable anterior septal infarct.  High-sensitivity troponin was 5.  A cardiac catheterization was performed which showed a 20% proximal to mid RCA stenosis, 50% stenosis of the ostial to proximal circumflex, 90% stenosis of the first diagonal lesion, 70% stenosis of the proximal to mid LAD, and 99% stenosis of the mid to distal LAD with 99% stenosis of the second diagonal.  Estimated ejection fraction was 50 to 55%.  We are consulted for possible surgical revascularization. He was seen by Dr. Orvan Seen and coronary bypass grafting was was recommended.  Mr. Bahn decided to proceed with surgery.  He is on disability for his chronic back pain. He lives alone and gets around okay. He takes care of himself and his residence  without help.    Hospital Course:  Mr. Birdsell remained stable following left heart catheterization.  He was taken to the operating room on 09/16/19 where CABG x 3 using all arterial conduit was carried out without complication.  Following surgery, he separated from cardiopulmonary bypass without difficulty and was later transferred to the CVICU.  His vital signs and hemodynamics remained stable. He was maintained on a nitroglycerine drip early post-op for optimal patency of the arterial grafts. He was extubated routinely and was mobilized early post-op. He made a progressive and uneventful recovery.   Consults: None  Significant Diagnostic Studies:   LEFT HEART CATH AND CORONARY ANGIOGRAPHY  Conclusion    Mid RCA lesion is 20% stenosed.  Prox RCA lesion is 20% stenosed.  Ost Cx to Prox Cx lesion is 50% stenosed.  Prox Cx lesion is 50% stenosed.  1st Mrg lesion is 50% stenosed.  1st Diag lesion is 90% stenosed.  Prox LAD to Mid LAD lesion is 70% stenosed.  Mid LAD to Dist LAD lesion is 99% stenosed with 99% stenosed side branch in 2nd Diag.   Multivessel CAD with moderate coronary calcification and evidence for diffuse chronic appearing long subtotal LAD stenosis with 90% ostial narrowing in the proximal diagonal vessel and subtotal second diagonal stenosis.  There are collaterals septal perforating arteries from the day supplying the mid distal vessel as well as collateralization from the circumflex marginal branch apically.  The circumflex vessel appears to have an ulcerated proximal plaque with residual stenosis of 50% followed by 50% bifurcation stenosis of the circumflex at the takeoff of the OM vessel and ostially involving the OM vessel.  The RCA is a dominant vessel with mild  luminal irregularity and mid stenoses of 20%.  There is significant septal collateralization to the mid distal LAD.  Low normal global LV contractility with suggestion of possible distal  anterolateral hypocontractility.  EF estimate at 50 to 55%.  LVEDP 9 mm.  RECOMMENDATION: The LAD lesion does not appear acute and most likely is chronic.  With the extensive calcified LAD and diagonal disease as well as probable ostial ulcerated plaque in the circumflex with bifurcation circumflex/OM stenoses consider surgical consultation for CABG revascularization surgery.  Initiate high potency statin therapy.  Will initiate beta-blocker therapy as well as low-dose nitrates.   Recommendations  Antiplatelet/Anticoag Recommend Aspirin 81mg  daily for moderate CAD.  Indications  Abnormal nuclear stress test [id number] (ICD-10-CM)]  Procedural Details  Technical Details Mr. Lenardo Guntrum is a 53 year old gentleman who has a 40-year history of tobacco abuse.  He has a history of Hodgkin's lymphoma remotely treated.  He developed chest discomfort earlier this week.  A nuclear stress test done at [facility] showed significant anterior ischemia.  He was transferred to [facility] and was seen in cardiology consultation by Dr. Davina Poke.  He is now referred for definitive cardiac catheterization.  The patient was brought to the cardiac catheterization lab in the fasting state. The patient was premedicated with Versed 2 mg and fentanyl 25 mcg.  Ultrasound guidance was used for vascular access.  The right radial artery was punctured via the Seldinger technique, and a 6 [place] Glidesheath Slender was inserted without difficulty.  A radial cocktail consisting of Verapamil 3 mg was administered. The patient received weight adjusted heparin. A safety J wire was advanced into the ascending aorta. Diagnostic catheterization was done with a 5 [place] TIG 4.0 catheter. A 5 French pigtail catheter was inserted for left ventricular pressure recording.  The catheter did not move freely and could not reach the apex.  As result ahand-injection was utilized rather than pressure injection. A TR radial band was applied for  hemostasis. The patient left the catheterization laboratory in stable condition.    Estimated blood loss <50 mL.   During this procedure medications were administered to achieve and maintain moderate conscious sedation while the patient's heart rate, blood pressure, and oxygen saturation were continuously monitored and I was present face-to-face 100% of this time.  Medications (Filter: Administrations occurring from 09/12/19 1033 to 09/12/19 1146) (important)  Continuous medications are totaled by the amount administered until 09/12/19 1146.  Medication Rate/Dose/Volume Action  Date Time   fentaNYL (SUBLIMAZE) injection (mcg) 25 mcg Given 09/12/19 1100   Total dose as of 09/12/19 1146        25 mcg        midazolam (VERSED) injection (mg) 2 mg Given 09/12/19 1100   Total dose as of 09/12/19 1146        2 mg        Heparin (Porcine) in NaCl 1000-0.9 UT/500ML-% SOLN (mL) 500 mL Given 09/12/19 1105   Total dose as of 09/12/19 1146 500 mL Given 1105   1,000 mL 500 mL Canceled Entry 1105   lidocaine (PF) (XYLOCAINE) 1 % injection (mL) 2 mL Given 09/12/19 1105   Total dose as of 09/12/19 1146        2 mL        Radial Cocktail/Verapamil only (mL) 10 mL Given 09/12/19 1106   Total dose as of 09/12/19 1146        10 mL        heparin injection (Units) 3,000  Units Given 09/12/19 1110   Total dose as of 09/12/19 1146        3,000 Units        iopamidol (ISOVUE-370) 76 % injection (mL) 75 mL Given 09/12/19 1132   Total dose as of 09/12/19 1146        75 mL        nicotine (NICODERM CQ - dosed in mg/24 hours) patch 21 mg (mg) *Not included in total [facility] Hold 09/12/19 1033   Dosing weight:  61 kg        Total dose as of 09/12/19 1146        Cannot be calculated        sodium chloride flush (NS) 0.9 % injection 3 mL (mL) *Not included in total [facility] Hold 09/12/19 1033   Total dose as of 09/12/19 1146        Cannot be calculated        acetaminophen (TYLENOL) tablet 650 mg (mg) *Not included in  total [facility] Hold 09/12/19 1033   Total dose as of 09/12/19 1146        Cannot be calculated        ondansetron (ZOFRAN) injection 4 mg (mg) *Not included in total MAR Hold 09/12/19 1033   Total dose as of 09/12/19 1146        Cannot be calculated        Sedation Time  Sedation Time Physician-1: 32 minutes 28 seconds  Contrast  Medication Name Total Dose  iopamidol (ISOVUE-370) 76 % injection 75 mL    Radiation/Fluoro  Fluoro time: 3.9 (min) DAP: 16.8 (Gycm2) Cumulative Air Kerma: 333 (mGy)  Coronary Findings  Diagnostic Dominance: Right Left Anterior Descending  Collaterals  Dist LAD filled by collaterals from 1st Mrg.    Prox LAD to Mid LAD lesion 70% stenosed  Prox LAD to Mid LAD lesion is 70% stenosed.  Mid LAD to Dist LAD lesion 99% stenosed with side branch in 2nd Diag 99% stenosed  Mid LAD to Dist LAD lesion is 99% stenosed with 99% stenosed side branch in 2nd Diag.  First Diagonal Branch  1st Diag lesion 90% stenosed  1st Diag lesion is 90% stenosed.  Second Septal Branch  Collaterals  2nd Sept filled by collaterals from 3rd RPL.    Left Circumflex  Ost Cx to Prox Cx lesion 50% stenosed  Ost Cx to Prox Cx lesion is 50% stenosed.  Prox Cx lesion 50% stenosed  Prox Cx lesion is 50% stenosed.  First Obtuse Marginal Branch  1st Mrg lesion 50% stenosed  1st Mrg lesion is 50% stenosed.  Right Coronary Artery  Prox RCA lesion 20% stenosed  Prox RCA lesion is 20% stenosed.  Mid RCA lesion 20% stenosed  Mid RCA lesion is 20% stenosed.  Intervention  No interventions have been documented. Wall Motion  Resting               Left Heart  Left Ventricle LVEDP was 9 mmHg.There appeared to be low normal to normal global EF with EF estimate at 50 to 55% although there was a suggestion of distal anterolateral hypocontractility.  Visualization was suboptimal.  Coronary Diagrams  Diagnostic Dominance: Right      ECHOCARDIOGRAM REPORT       Patient  Name:   SUNAY CHOCK Kronenberger Date of Exam: 09/12/2019 Medical Rec #:  [id number]   Height:       72.0 in Accession #:    [id number]  Weight:  133.6 lb Date of Birth:  [birth year]    BSA:          1.79 m Patient Age:    26 years    BP:           119/97 mmHg Patient Gender: M           HR:           63 bpm. Exam Location:  Inpatient  Procedure: 2D Echo  Indications:    CAD 414.01   History:        Patient has no prior history of Echocardiogram examinations.                 COPD Risk Factors:Current Smoker. Tobacco abuse.   Sonographer:    Jannett Celestine RDCS (AE) Referring Phys: [id number] [place]    Sonographer Comments: Suboptimal parasternal window. Image acquisition challenging due to respiratory motion. IMPRESSIONS    1. Left ventricular ejection fraction, by visual estimation, is 50 to 55%. The left ventricle has mildly decreased function. Normal left ventricular size. There is no left ventricular hypertrophy. False tendon in the left ventricle.  2. Basal and mid inferolateral wall is abnormal.  3. Elevated left ventricular end-diastolic pressure.  4. Left ventricular diastolic Doppler parameters are consistent with impaired relaxation pattern of LV diastolic filling.  5. Global right ventricle has mildly reduced systolic function.The right ventricular size is mildly enlarged. No increase in right ventricular wall thickness.  6. Left atrial size was normal.  7. Right atrial size was normal.  8. The mitral valve is normal in structure. No evidence of mitral valve regurgitation. No evidence of mitral stenosis.  9. The tricuspid valve is normal in structure. Tricuspid valve regurgitation was not visualized by color flow Doppler. 10. The aortic valve is normal in structure. Aortic valve regurgitation was not visualized by color flow Doppler. Structurally normal aortic valve, with no evidence of sclerosis or stenosis. 11. The pulmonic valve was normal in structure. Pulmonic  valve regurgitation is not visualized by color flow Doppler. 12. The inferior vena cava is normal in size with greater than 50% respiratory variability, suggesting right atrial pressure of 3 mmHg.  FINDINGS  Left Ventricle: Left ventricular ejection fraction, by visual estimation, is 50 to 55%. The left ventricle has mildly decreased function. There is no left ventricular hypertrophy. Normal left ventricular size. Spectral Doppler shows Left ventricular  diastolic Doppler parameters are consistent with impaired relaxation pattern of LV diastolic filling. Elevated left ventricular end-diastolic pressure.    LV Wall Scoring: The basal and mid inferolateral wall is hypokinetic.  Right Ventricle: The right ventricular size is mildly enlarged. No increase in right ventricular wall thickness. Global RV systolic function is has mildly reduced systolic function.  Left Atrium: Left atrial size was normal in size.  Right Atrium: Right atrial size was normal in size  Pericardium: There is no evidence of pericardial effusion.  Mitral Valve: The mitral valve is normal in structure. No evidence of mitral valve stenosis by observation. No evidence of mitral valve regurgitation.  Tricuspid Valve: The tricuspid valve is normal in structure. Tricuspid valve regurgitation was not visualized by color flow Doppler.  Aortic Valve: The aortic valve is normal in structure. Aortic valve regurgitation was not visualized by color flow Doppler. The aortic valve is structurally normal, with no evidence of sclerosis or stenosis.  Pulmonic Valve: The pulmonic valve was normal in structure. Pulmonic valve regurgitation is not visualized by color flow Doppler.  Aorta: The aortic root, ascending aorta and aortic arch are all structurally normal, with no evidence of dilitation or obstruction.  Venous: The inferior vena cava is normal in size with greater than 50% respiratory variability, suggesting right  atrial pressure of 3 mmHg.  IAS/Shunts: No atrial level shunt detected by color flow Doppler. No ventricular septal defect is seen or detected. There is no evidence of an atrial septal defect.     LEFT VENTRICLE          Normals PLAX 2D LVIDd:         5.00 cm  3.6 cm   Diastology                  Normals LVIDs:         3.80 cm  1.7 cm   LV e' lateral:   14.95 cm/s 6.42 cm/s LV PW:         1.15 cm  1.4 cm   LV E/e' lateral: 4.4        15.4 LV IVS:        0.95 cm  1.3 cm   LV e' medial:    9.68 cm/s  6.96 cm/s LVOT diam:     2.20 cm  2.0 cm   LV E/e' medial:  6.9        6.96 LV SV:         56 ml    79 ml LV SV Index:   32.37    45 ml/m2 LVOT Area:     3.80 cm 3.14 cm2    LEFT ATRIUM           Index LA diam:      2.20 cm 1.23 cm/m LA Vol (A2C): 31.8 ml 17.72 ml/m LA Vol (A4C): 26.6 ml 14.82 ml/m  AORTIC VALVE             Normals LVOT Vmax:   84.20 cm/s LVOT Vmean:  60.500 cm/s 75 cm/s LVOT VTI:    0.182 m     25.3 cm   AORTA                 Normals Ao Root diam: 2.60 cm 31 mm  MITRAL VALVE               Normals MV Area (PHT): 1.90 cm             SHUNTS MV PHT:        115.85 msec 55 ms    Systemic VTI:  0.18 m MV Decel Time: 400 msec    187 ms   Systemic Diam: 2.20 cm MV E velocity: 66.45 cm/s 103 cm/s MV A velocity: 43.70 cm/s 70.3 cm/s MV E/A ratio:  1.52       1.5    Ena Dawley MD Electronically signed by Ena Dawley MD Signature Date/Time: 09/12/2019/5:45:18 PM  Treatments:   Discharge Exam: Blood pressure (!) 100/58, pulse 83, temperature 98.7 F (37.1 C), temperature source Oral, resp. rate (!) 24, height 6' (1.829 m), weight 61.1 kg, SpO2 99 %.  General appearance: alert, cooperative and no distress Heart: regular rate and rhythm Lungs: clear to auscultation bilaterally Abdomen: benign Extremities: no edema Wound: incis healing well  Disposition: Discharge disposition: 01-Home or Self Care      Discharge Instructions    Amb Referral  to Cardiac Rehabilitation   Complete by: As directed    To [place]   Diagnosis: CABG   CABG X ___:  3   After initial evaluation and assessments completed: Virtual Based Care may be provided alone or in conjunction with Phase 2 Cardiac Rehab based on patient barriers.: Yes   Discharge patient   Complete by: As directed    Discharge disposition: 01-Home or Self Care   Discharge patient date: 09/21/2019     Allergies as of 09/21/2019      Reactions   Ketorolac Hives   Tramadol Hives      Medication List    TAKE these medications   acetaminophen 325 MG tablet Commonly known as: TYLENOL Take 650 mg by mouth every 6 (six) hours as needed for mild pain or headache.   aspirin 81 MG EC tablet Take 1 tablet (81 mg total) by mouth daily.   atorvastatin 80 MG tablet Commonly known as: LIPITOR Take 1 tablet (80 mg total) by mouth daily at 6 PM.   clopidogrel 75 MG tablet Commonly known as: PLAVIX Take 1 tablet (75 mg total) by mouth daily.   multivitamin with minerals Tabs tablet Take 1 tablet by mouth daily.   oxyCODONE 5 MG immediate release tablet Commonly known as: Oxy IR/ROXICODONE Take 1 tablet (5 mg total) by mouth every 6 (six) hours as needed for up to 7 days for severe pain.      Follow-up Information    Tobb, Kardie, DO Follow up.   Specialty: Cardiology Why: Cardiology hospital follow up on Monday 09/22/2019 at 9:00 am. Please arrive 15 minutes early for check in.  Contact information: [place] [address] [phone]        Troy Sine, MD Follow up.   Specialty: Cardiology Why: Please see discharge paperwork for follow-up appointment with cardiology. Contact information: [address] [place] [address] [phone]        Wonda Olds, MD Follow up.   Specialty: Cardiothoracic Surgery Why: Please see discharge paperwork for follow-up appointment with Dr. Orvan Seen.  Also obtain chest x-ray at [facility] 1/2-hour prior to this appointment.  [facility] is located in the same office complex. Contact information: [address] [place] [address] [phone]          The patient has been discharged on:   1.Beta Blocker:  Yes [ x  ]                              No   [   ]                              If No, reason:  2.Ace Inhibitor/ARB: Yes [   ]                                     No  [  x  ]                                     If No, reason: Will be added as out-patient  3.Statin:   Yes [ x  ]                  No  [   ]  If No, reason:  4.Ecasa:  Yes  [ x  ]                  No   [   ]                  If No, reason:   Signed: Antony Odea, PA-C 09/26/2019, 3:25 PM

## 2019-09-16 NOTE — Transfer of Care (Signed)
Immediate Anesthesia Transfer of Care Note  Patient: Russell Harris  Procedure(s) Performed: CORONARY ARTERY BYPASS GRAFTING (CABG) x 3 WITH BILATERAL IMA'S AND RIGHT RADIAL ARTERY OPEN HARVESTING. (N/A Chest) TRANSESOPHAGEAL ECHOCARDIOGRAM (TEE) (N/A )  Patient Location: ICU  Anesthesia Type:General  Level of Consciousness: Patient remains intubated per anesthesia plan  Airway & Oxygen Therapy: Patient remains intubated per anesthesia plan and Patient placed on Ventilator (see vital sign flow sheet for setting)  Post-op Assessment: Report given to RN and Post -op Vital signs reviewed and stable  Post vital signs: Reviewed and stable  Last Vitals:  Vitals Value Taken Time  BP 108/62 09/16/19 1413  Temp 35.3 C 09/16/19 1416  Pulse 79 09/16/19 1416  Resp 14 09/16/19 1416  SpO2 99 % 09/16/19 1416  Vitals shown include unvalidated device data.  Last Pain:  Vitals:   09/16/19 0503  TempSrc: Oral  PainSc:       Patients Stated Pain Goal: 0 ([id number])  Complications: No apparent anesthesia complications

## 2019-09-17 ENCOUNTER — Encounter (HOSPITAL_COMMUNITY): Payer: Self-pay | Admitting: Cardiothoracic Surgery

## 2019-09-17 ENCOUNTER — Inpatient Hospital Stay (HOSPITAL_COMMUNITY): Payer: Medicare HMO

## 2019-09-17 DIAGNOSIS — I2510 Atherosclerotic heart disease of native coronary artery without angina pectoris: Secondary | ICD-10-CM

## 2019-09-17 LAB — PREPARE FRESH FROZEN PLASMA: Unit division: 0

## 2019-09-17 LAB — CBC WITH DIFFERENTIAL/PLATELET
Abs Immature Granulocytes: 0.03 10*3/uL (ref 0.00–0.07)
Basophils Absolute: 0 10*3/uL (ref 0.0–0.1)
Basophils Relative: 0 %
Eosinophils Absolute: 0 10*3/uL (ref 0.0–0.5)
Eosinophils Relative: 0 %
HCT: 30.1 % — ABNORMAL LOW (ref 39.0–52.0)
Hemoglobin: 10.3 g/dL — ABNORMAL LOW (ref 13.0–17.0)
Immature Granulocytes: 0 %
Lymphocytes Relative: 11 %
Lymphs Abs: 1.1 10*3/uL (ref 0.7–4.0)
MCH: 32.6 pg (ref 26.0–34.0)
MCHC: 34.2 g/dL (ref 30.0–36.0)
MCV: 95.3 fL (ref 80.0–100.0)
Monocytes Absolute: 1.3 10*3/uL — ABNORMAL HIGH (ref 0.1–1.0)
Monocytes Relative: 12 %
Neutro Abs: 8 10*3/uL — ABNORMAL HIGH (ref 1.7–7.7)
Neutrophils Relative %: 77 %
Platelets: 81 10*3/uL — ABNORMAL LOW (ref 150–400)
RBC: 3.16 MIL/uL — ABNORMAL LOW (ref 4.22–5.81)
RDW: 13.1 % (ref 11.5–15.5)
WBC: 10.5 10*3/uL (ref 4.0–10.5)
nRBC: 0 % (ref 0.0–0.2)

## 2019-09-17 LAB — BPAM FFP
Blood Product Expiration Date: 202009272359
Blood Product Expiration Date: 202009272359
ISSUE DATE / TIME: 202009222001
ISSUE DATE / TIME: 202009222128
Unit Type and Rh: 6200
Unit Type and Rh: 6200

## 2019-09-17 LAB — BASIC METABOLIC PANEL
Anion gap: 8 (ref 5–15)
Anion gap: 8 (ref 5–15)
BUN: 8 mg/dL (ref 6–20)
BUN: 13 mg/dL (ref 6–20)
CO2: 23 mmol/L (ref 22–32)
CO2: 25 mmol/L (ref 22–32)
Calcium: 8.2 mg/dL — ABNORMAL LOW (ref 8.9–10.3)
Calcium: 8.4 mg/dL — ABNORMAL LOW (ref 8.9–10.3)
Chloride: 106 mmol/L (ref 98–111)
Chloride: 105 mmol/L (ref 98–111)
Creatinine, Ser: 0.83 mg/dL (ref 0.61–1.24)
Creatinine, Ser: 0.98 mg/dL (ref 0.61–1.24)
GFR calc Af Amer: >60 mL/min (ref >60)
GFR calc Af Amer: >60 mL/min (ref >60)
GFR calc non Af Amer: >60 mL/min (ref >60)
GFR calc non Af Amer: >60 mL/min (ref >60)
Glucose, Bld: 145 mg/dL — ABNORMAL HIGH (ref 70–99)
Glucose, Bld: 171 mg/dL — ABNORMAL HIGH (ref 70–99)
Potassium: 3.6 mmol/L (ref 3.5–5.1)
Potassium: 5.1 mmol/L (ref 3.5–5.1)
Sodium: 137 mmol/L (ref 135–145)
Sodium: 138 mmol/L (ref 135–145)

## 2019-09-17 LAB — MAGNESIUM
Magnesium: 1.8 mg/dL (ref 1.7–2.4)
Magnesium: 1.8 mg/dL (ref 1.7–2.4)

## 2019-09-17 LAB — CBC
HCT: 26.6 % — ABNORMAL LOW (ref 39.0–52.0)
Hemoglobin: 9 g/dL — ABNORMAL LOW (ref 13.0–17.0)
MCH: 32.5 pg (ref 26.0–34.0)
MCHC: 33.8 g/dL (ref 30.0–36.0)
MCV: 96 fL (ref 80.0–100.0)
Platelets: 80 10*3/uL — ABNORMAL LOW (ref 150–400)
RBC: 2.77 MIL/uL — ABNORMAL LOW (ref 4.22–5.81)
RDW: 13.9 % (ref 11.5–15.5)
WBC: 14 10*3/uL — ABNORMAL HIGH (ref 4.0–10.5)
nRBC: 0 % (ref 0.0–0.2)

## 2019-09-17 LAB — GLUCOSE, CAPILLARY
Glucose-Capillary: 104 mg/dL — ABNORMAL HIGH (ref 70–99)
Glucose-Capillary: 108 mg/dL — ABNORMAL HIGH (ref 70–99)
Glucose-Capillary: 121 mg/dL — ABNORMAL HIGH (ref 70–99)
Glucose-Capillary: 128 mg/dL — ABNORMAL HIGH (ref 70–99)
Glucose-Capillary: 134 mg/dL — ABNORMAL HIGH (ref 70–99)
Glucose-Capillary: 164 mg/dL — ABNORMAL HIGH (ref 70–99)

## 2019-09-17 MED ORDER — CLOPIDOGREL BISULFATE 75 MG PO TABS
75.0000 mg | ORAL_TABLET | Freq: Every day | ORAL | Status: DC
  Administered 2019-09-17 – 2019-09-21 (×5): 75 mg via ORAL
  Filled 2019-09-17 (×5): qty 1

## 2019-09-17 MED ORDER — ENSURE ENLIVE PO LIQD
237.0000 mL | Freq: Two times a day (BID) | ORAL | Status: DC
  Administered 2019-09-17 – 2019-09-20 (×3): 237 mL via ORAL

## 2019-09-17 MED ORDER — COLCHICINE 0.6 MG PO TABS
0.6000 mg | ORAL_TABLET | Freq: Every day | ORAL | Status: DC
  Administered 2019-09-17 – 2019-09-21 (×5): 0.6 mg via ORAL
  Filled 2019-09-17 (×5): qty 1

## 2019-09-17 MED ORDER — ASPIRIN EC 81 MG PO TBEC
81.0000 mg | DELAYED_RELEASE_TABLET | Freq: Every day | ORAL | Status: DC
  Administered 2019-09-18 – 2019-09-21 (×4): 81 mg via ORAL
  Filled 2019-09-17 (×4): qty 1

## 2019-09-17 MED ORDER — ORAL CARE MOUTH RINSE: 15.0000 mL | Freq: Two times a day (BID) | OROMUCOSAL | Status: DC

## 2019-09-17 MED ORDER — MAGNESIUM OXIDE 400 (241.3 MG) MG PO TABS
400.0000 mg | ORAL_TABLET | Freq: Two times a day (BID) | ORAL | Status: DC
  Administered 2019-09-17 – 2019-09-21 (×9): 400 mg via ORAL
  Filled 2019-09-17 (×9): qty 1

## 2019-09-17 MED ORDER — INSULIN ASPART 100 UNIT/ML ~~LOC~~ SOLN
0.0000 [IU] | SUBCUTANEOUS | Status: DC
  Administered 2019-09-17: 16:00:00 2 [IU] via SUBCUTANEOUS
  Administered 2019-09-17: 4 [IU] via SUBCUTANEOUS
  Administered 2019-09-17 – 2019-09-19 (×5): 2 [IU] via SUBCUTANEOUS
  Administered 2019-09-19: 4 [IU] via SUBCUTANEOUS
  Administered 2019-09-20: 2 [IU] via SUBCUTANEOUS

## 2019-09-17 MED ORDER — POTASSIUM CHLORIDE CRYS ER 20 MEQ PO TBCR
20.0000 meq | EXTENDED_RELEASE_TABLET | ORAL | Status: AC
  Administered 2019-09-17 (×3): 20 meq via ORAL
  Filled 2019-09-17 (×3): qty 1

## 2019-09-17 MED ORDER — DIAZEPAM 2 MG PO TABS: 2.0000 mg | ORAL_TABLET | Freq: Four times a day (QID) | ORAL | Status: DC | PRN

## 2019-09-17 MED ORDER — ADULT MULTIVITAMIN W/MINERALS CH
1.0000 | ORAL_TABLET | Freq: Every day | ORAL | Status: DC
  Administered 2019-09-17 – 2019-09-21 (×5): 1 via ORAL
  Filled 2019-09-17 (×5): qty 1

## 2019-09-17 NOTE — Addendum Note (Signed)
Addendum  created 09/17/19 0820 by Josephine Igo, CRNA   Order list changed

## 2019-09-17 NOTE — Progress Notes (Signed)
[address]       [place] [address]             [phone]      1 Day Post-Op Procedure(s) (LRB): CORONARY ARTERY BYPASS GRAFTING (CABG) x 3 WITH BILATERAL IMA'S AND RIGHT RADIAL ARTERY OPEN HARVESTING. (N/A) TRANSESOPHAGEAL ECHOCARDIOGRAM (TEE) (N/A) Subjective: Doing well, moderate discomfort(usual)  Objective: Vital signs in last 24 hours: Temp:  [95.4 F (35.2 C)-99 F (37.2 C)] 98.4 F (36.9 C) (09/23 0900) Pulse Rate:  [79-133] 91 (09/23 0900) Cardiac Rhythm: Normal sinus rhythm (09/23 0730) Resp:  [12-31] 26 (09/23 0900) BP: (92-114)/(55-77) 92/61 (09/23 0900) SpO2:  [91 %-100 %] 99 % (09/23 0900) Arterial Line BP: (78-142)/(45-85) 102/57 (09/23 0900) FiO2 (%):  [40 %-50 %] 40 % (09/22 1723) Weight:  [59.4 kg] 59.4 kg (09/23 0500)  Hemodynamic parameters for last 24 hours: PAP: (16-53)/(2-15) 21/12 CO:  [3.1 L/min-5.9 L/min] 5.6 L/min CI:  [1.7 L/min/m2-3.3 L/min/m2] 3.1 L/min/m2  Intake/Output from previous day: 09/22 0701 - 09/23 0700 In: 5343.5 [I.V.:3945.2; Blood:863.3; IV Piggyback:535] Out: 3202 [Urine:3280; Blood:400; Chest Tube:1250] Intake/Output this shift: Total I/O In: 83.9 [I.V.:83.9] Out: 145 [Urine:125; Chest Tube:20]  General appearance: alert, cooperative and no distress Neurologic: intact Heart: regular rate and rhythm Lungs: pretty clear anteriorly Abdomen: soft, nontender, nondistended Extremities: no significant edema Wound: radial site ok- hand is N/V intact, chest dressing intact  Lab Results: Recent Labs    09/16/19 2230 09/17/19 0615  WBC 8.0 10.5  HGB 6.4* 10.3*  HCT 18.4* 30.1*  PLT 80* 81*   BMET:  Recent Labs    09/16/19 1930 09/16/19 1941 09/17/19 0610  NA 137 142 137  K 4.1 4.2 3.6  CL 109  --  106  CO2 23  --  23  GLUCOSE 117*  --  145*  BUN 9  --  8  CREATININE 0.81  --  0.83  CALCIUM 8.8*  --  8.2*    PT/INR:  Recent Labs    09/16/19 1423  LABPROT 16.2*  INR 1.3*   ABG     Component Value Date/Time   PHART 7.342 (L) 09/16/2019 1941   HCO3 22.2 09/16/2019 1941   TCO2 23 09/16/2019 1941   ACIDBASEDEF 3.0 (H) 09/16/2019 1941   O2SAT 97.0 09/16/2019 1941   CBG (last 3)  Recent Labs    09/17/19 0404 09/17/19 0622 09/17/19 0740  GLUCAP 104* 134* 121*    Meds Scheduled Meds: . acetaminophen  1,000 mg Oral Q6H   Or  . acetaminophen (TYLENOL) oral liquid 160 mg/5 mL  1,000 mg Per Tube Q6H  . aspirin EC  325 mg Oral Daily   Or  . aspirin  324 mg Per Tube Daily  . atorvastatin  80 mg Oral q1800  . bisacodyl  10 mg Oral Daily   Or  . bisacodyl  10 mg Rectal Daily  . chlorhexidine gluconate (MEDLINE KIT)  15 mL Mouth Rinse BID  . Chlorhexidine Gluconate Cloth  6 each Topical Daily  . docusate sodium  200 mg Oral Daily  . insulin aspart  0-24 Units Subcutaneous Q4H  . isosorbide mononitrate  30 mg Oral Daily  . isosorbide mononitrate  30 mg Oral Daily  . mouth rinse  15 mL Mouth Rinse q12n4p  . metoprolol tartrate  12.5 mg Oral BID   Or  . metoprolol tartrate  12.5 mg Per Tube BID  . [START ON 09/18/2019] pantoprazole  40 mg  Oral Daily  . potassium chloride  20 mEq Oral Q4H  . sodium chloride flush  3 mL Intravenous Q12H   Continuous Infusions: . sodium chloride 20 mL/hr at 09/17/19 0900  . sodium chloride    . sodium chloride 10 mL/hr at 09/16/19 1445  . albumin human 12.5 g (09/16/19 1836)  . cefUROXime (ZINACEF)  IV Stopped (09/17/19 0453)  . dexmedetomidine (PRECEDEX) IV infusion Stopped (09/16/19 1707)  . famotidine (PEPCID) IV Stopped (09/16/19 1459)  . lactated ringers    . lactated ringers 20 mL/hr at 09/17/19 0900  . lactated ringers Stopped (09/16/19 1443)  . nitroGLYCERIN 7 mcg/min (09/17/19 0900)  . phenylephrine (NEO-SYNEPHRINE) Adult infusion Stopped (09/17/19 0602)   PRN Meds:.sodium chloride, albumin human, lactated ringers, metoprolol tartrate, midazolam, morphine injection, ondansetron (ZOFRAN) IV, oxyCODONE, sodium  chloride flush  Xrays Dg Chest Port 1 View  Result Date: 09/17/2019 CLINICAL DATA:  Status post coronary bypass graft. EXAM: PORTABLE CHEST 1 VIEW COMPARISON:  Radiographs of September 16, 2019. FINDINGS: Stable cardiomediastinal silhouette. Stable bilateral chest tubes are noted. Small right apical pneumothorax is now noted. Minimal left apical pneumothorax is noted. Bilateral subsegmental atelectasis is noted with probable small pleural effusions. Bony thorax is unremarkable. Endotracheal and nasogastric tubes have been removed. Right internal jugular catheter is noted with tip directed into right pulmonary artery. IMPRESSION: Stable bilateral chest tubes are noted. Probable small right apical pneumothorax is now noted, with possible minimal left apical pneumothorax. Bibasilar subsegmental atelectasis is noted with probable associated pleural effusions. Electronically Signed   By: Marijo Conception M.D.   On: 09/17/2019 07:10   Dg Chest Port 1 View  Result Date: 09/16/2019 CLINICAL DATA:  Post coronary artery bypass grafting. EXAM: PORTABLE CHEST 1 VIEW COMPARISON:  Chest x-ray, preoperative evaluation on 162020 FINDINGS: Endotracheal tube in situ. A right internal jugular sheath transmit Swan-Ganz catheter into the area of the right main pulmonary artery. Mediastinal drain projects over cardiac silhouette with bilateral chest tubes in situ as well. No visible pneumothorax on frontal view and supine position. Enteric tube courses off the field of the radiograph. Sternal wires and anchors are demonstrated in the midline of the chest. Lungs are clear aside from minimal basilar atelectasis. No signs of pleural effusion. IMPRESSION: Postoperative changes with mediastinal drains and chest tubes in place, no visible pneumothorax or dense consolidation. Electronically Signed   By: Zetta Bills M.D.   On: 09/16/2019 15:20    Assessment/Plan: S/P Procedure(s) (LRB): CORONARY ARTERY BYPASS GRAFTING (CABG) x 3  WITH BILATERAL IMA'S AND RIGHT RADIAL ARTERY OPEN HARVESTING. (N/A) TRANSESOPHAGEAL ECHOCARDIOGRAM (TEE) (N/A)  1 doing well overall 2 hemodyn stable with low relative  systolic BP , converting NTG to Imdur for arterial grafts. Does get sinus tach at times with pain/movement 3 sats ok on 2 liters, usual pulm toilet 4 small apical pntx bilat, cont pleural CT's , 1250 cc drainage yesterday post op 5 anemia has stabilized after platelets and PRBC's 6 thrombocytopenia- stable at 80K, monitor 7 no leukocytosis 8 d/c swan ganz 9 progression orders  LOS: 6 days    [facility] 09/17/2019 Pager [phone]

## 2019-09-17 NOTE — Plan of Care (Signed)
  Problem: Education: Goal: Understanding of CV disease, CV risk reduction, and recovery process will improve Outcome: Progressing Goal: Individualized Educational Video(s) Outcome: Progressing   Problem: Activity: Goal: Ability to return to baseline activity level will improve Outcome: Progressing   Problem: Cardiovascular: Goal: Ability to achieve and maintain adequate cardiovascular perfusion will improve Outcome: Progressing   Problem: Health Behavior/Discharge Planning: Goal: Ability to safely manage health-related needs after discharge will improve Outcome: Progressing   Problem: Education: Goal: Knowledge of General Education information will improve Description: Including pain rating scale, medication(s)/side effects and non-pharmacologic comfort measures Outcome: Progressing   Problem: Health Behavior/Discharge Planning: Goal: Ability to manage health-related needs will improve Outcome: Progressing   Problem: Clinical Measurements: Goal: Ability to maintain clinical measurements within normal limits will improve Outcome: Progressing Goal: Will remain free from infection Outcome: Progressing Goal: Diagnostic test results will improve Outcome: Progressing Goal: Respiratory complications will improve Outcome: Progressing Goal: Cardiovascular complication will be avoided Outcome: Progressing   Problem: Activity: Goal: Risk for activity intolerance will decrease Outcome: Progressing   Problem: Nutrition: Goal: Adequate nutrition will be maintained Outcome: Progressing   Problem: Coping: Goal: Level of anxiety will decrease Outcome: Progressing   Problem: Elimination: Goal: Will not experience complications related to bowel motility Outcome: Progressing Goal: Will not experience complications related to urinary retention Outcome: Progressing   Problem: Pain Managment: Goal: General experience of comfort will improve Outcome: Progressing   Problem:  Safety: Goal: Ability to remain free from injury will improve Outcome: Progressing   Problem: Skin Integrity: Goal: Risk for impaired skin integrity will decrease Outcome: Progressing   

## 2019-09-17 NOTE — Progress Notes (Signed)
Initial Nutrition Assessment  DOCUMENTATION CODES:   Severe malnutrition in context of chronic illness, Underweight  INTERVENTION:   - Ensure Enlive po BID, each supplement provides 350 kcal and 20 grams of protein  - MVI with minerals daily  - Encourage adequate PO intake  NUTRITION DIAGNOSIS:   Severe Malnutrition related to chronic illness (Hodgkin's lymphoma, COPD) as evidenced by moderate fat depletion, severe fat depletion, moderate muscle depletion, severe muscle depletion.  GOAL:   Patient will meet greater than or equal to 90% of their needs  MONITOR:   PO intake, Supplement acceptance, Labs, Weight trends, Skin, I & O's  REASON FOR ASSESSMENT:   Other (low BMI)    ASSESSMENT:   53 year old male who presented on 9/17 from [facility] with chest pain. PMH of HTN, Hodgkin's lymphoma, COPD, tobacco abuse.   9/18 - s/p cardiac cath showing multivessel CAD 9/22 - s/p CABG x 3  Discussed pt with RN and during ICU rounds.  Spoke with pt at bedside. Pt was sleeping at time of RD visit but awoke quickly to RD voice.  Pt reports he is feeling very sore at this time. Pt states that he has no appetite currently. Pt states he is waiting for his appetite to come back after surgery.  Pt reports that he typically has a good appetite and eats 2 meals and multiple snacks daily. A meal may include a sandwich. Pt snacks on foods like chips.  Pt denies any recent weight loss. Pt states that about 22 years ago, he lost about 40 lbs after first being diagnosed wit Hodgkin's lymphoma and has been unable to gain that weight back. Pt reports his UBW prior to weight loss was 180 lbs but that he has been maintaining between 135-140 lbs over the last two decades.  Pt is willing to consume and oral nutrition supplement to aid in meeting kcal and protein needs. RD will also order a daily MVI with minerals.  Meal Completion: 75-100% x last 3 recorded meals  Medications reviewed and  include: Dulcolax, Colace, SSI, magnesium oxide 400 m BID, Protonix, K-dur 20 mEq q 4 hours x 3, IV abx  Labs reviewed. CBG's: 98-167 x 24 hours  UOP: 3280 ml x 24 hours CT: 1250 ml x 24 hours I/O's: -2.2 L since admit  NUTRITION - FOCUSED PHYSICAL EXAM:    Most Recent Value  Orbital Region  Moderate depletion  Upper Arm Region  Severe depletion  Thoracic and Lumbar Region  Severe depletion  Buccal Region  Moderate depletion  Temple Region  Moderate depletion  Clavicle Bone Region  Severe depletion  Clavicle and Acromion Bone Region  Severe depletion  Scapular Bone Region  Severe depletion  Dorsal Hand  Moderate depletion  Patellar Region  Severe depletion  Anterior Thigh Region  Severe depletion  Posterior Calf Region  Severe depletion  Edema (RD Assessment)  None  Hair  Reviewed  Eyes  Reviewed  Mouth  Reviewed  Skin  Reviewed  Nails  Reviewed       Diet Order:   Diet Order            Diet Carb Modified Fluid consistency: Thin; Room service appropriate? Yes  Diet effective now              EDUCATION NEEDS:   Education needs have been addressed  Skin:  Skin Assessment: Skin Integrity Issues: Skin Integrity Issues: Incisions: closed incision right arm, chest  Last BM:  09/15/19  Height:  Ht Readings from Last 1 Encounters:  09/15/19 6' (1.829 m)    Weight:   Wt Readings from Last 1 Encounters:  09/17/19 59.4 kg    Ideal Body Weight:  80.9 kg  BMI:  Body mass index is 17.76 kg/m.  Estimated Nutritional Needs:   Kcal:  1900-2100  Protein:  85-100 grams  Fluid:  >/= 1.8 L    Gaynell Face, MS, RD, LDN Inpatient Clinical Dietitian Pager: [phone] Weekend/After Hours: [phone]

## 2019-09-17 NOTE — Progress Notes (Signed)
Date and time results received: 09/16/2019@2258   Test: Critical Value: hemoglobin 6.4  Name of Provider Notified: Dr, Prescott Gum  Orders Received? Or Actions Taken?: Orders Received - See Orders for details Orders received

## 2019-09-18 ENCOUNTER — Inpatient Hospital Stay (HOSPITAL_COMMUNITY): Payer: Medicare HMO

## 2019-09-18 DIAGNOSIS — E43 Unspecified severe protein-calorie malnutrition: Secondary | ICD-10-CM

## 2019-09-18 HISTORY — DX: Unspecified severe protein-calorie malnutrition: E43

## 2019-09-18 LAB — TYPE AND SCREEN
ABO/RH(D): A POS
Antibody Screen: NEGATIVE
Unit division: 0
Unit division: 0

## 2019-09-18 LAB — CBC
HCT: 24.9 % — ABNORMAL LOW (ref 39.0–52.0)
Hemoglobin: 8.7 g/dL — ABNORMAL LOW (ref 13.0–17.0)
MCH: 33.3 pg (ref 26.0–34.0)
MCHC: 34.9 g/dL (ref 30.0–36.0)
MCV: 95.4 fL (ref 80.0–100.0)
Platelets: 77 10*3/uL — ABNORMAL LOW (ref 150–400)
RBC: 2.61 MIL/uL — ABNORMAL LOW (ref 4.22–5.81)
RDW: 13.9 % (ref 11.5–15.5)
WBC: 14.8 10*3/uL — ABNORMAL HIGH (ref 4.0–10.5)
nRBC: 0 % (ref 0.0–0.2)

## 2019-09-18 LAB — BPAM RBC
Blood Product Expiration Date: 202010182359
Blood Product Expiration Date: 202010182359
ISSUE DATE / TIME: 202009230006
ISSUE DATE / TIME: 202009230217
Unit Type and Rh: 6200
Unit Type and Rh: 6200

## 2019-09-18 LAB — BASIC METABOLIC PANEL
Anion gap: 7 (ref 5–15)
BUN: 11 mg/dL (ref 6–20)
CO2: 28 mmol/L (ref 22–32)
Calcium: 8.6 mg/dL — ABNORMAL LOW (ref 8.9–10.3)
Chloride: 102 mmol/L (ref 98–111)
Creatinine, Ser: 0.94 mg/dL (ref 0.61–1.24)
GFR calc Af Amer: >60 mL/min (ref >60)
GFR calc non Af Amer: >60 mL/min (ref >60)
Glucose, Bld: 104 mg/dL — ABNORMAL HIGH (ref 70–99)
Potassium: 4.4 mmol/L (ref 3.5–5.1)
Sodium: 137 mmol/L (ref 135–145)

## 2019-09-18 LAB — GLUCOSE, CAPILLARY
Glucose-Capillary: 105 mg/dL — ABNORMAL HIGH (ref 70–99)
Glucose-Capillary: 119 mg/dL — ABNORMAL HIGH (ref 70–99)
Glucose-Capillary: 126 mg/dL — ABNORMAL HIGH (ref 70–99)
Glucose-Capillary: 131 mg/dL — ABNORMAL HIGH (ref 70–99)
Glucose-Capillary: 152 mg/dL — ABNORMAL HIGH (ref 70–99)
Glucose-Capillary: 161 mg/dL — ABNORMAL HIGH (ref 70–99)
Glucose-Capillary: 86 mg/dL (ref 70–99)
Glucose-Capillary: 88 mg/dL (ref 70–99)

## 2019-09-18 MED ORDER — ISOSORBIDE DINITRATE 10 MG PO TABS
10.0000 mg | ORAL_TABLET | Freq: Three times a day (TID) | ORAL | Status: DC
  Administered 2019-09-18 (×3): 10 mg via ORAL
  Filled 2019-09-18 (×3): qty 1

## 2019-09-18 MED ORDER — FE FUMARATE-B12-VIT C-FA-IFC PO CAPS
1.0000 | ORAL_CAPSULE | Freq: Two times a day (BID) | ORAL | Status: DC
  Administered 2019-09-18 – 2019-09-21 (×7): 1 via ORAL
  Filled 2019-09-18 (×7): qty 1

## 2019-09-18 MED FILL — Sodium Bicarbonate IV Soln 8.4%: INTRAVENOUS | Qty: 50 | Status: AC

## 2019-09-18 MED FILL — Sodium Chloride IV Soln 0.9%: INTRAVENOUS | Qty: 1000 | Status: AC

## 2019-09-18 MED FILL — Electrolyte-R (PH 7.4) Solution: INTRAVENOUS | Qty: 3000 | Status: AC

## 2019-09-18 MED FILL — Magnesium Sulfate Inj 50%: INTRAMUSCULAR | Qty: 10 | Status: AC

## 2019-09-18 MED FILL — Mannitol IV Soln 20%: INTRAVENOUS | Qty: 500 | Status: AC

## 2019-09-18 MED FILL — Heparin Sodium (Porcine) Inj 1000 Unit/ML: INTRAMUSCULAR | Qty: 30 | Status: AC

## 2019-09-18 MED FILL — Lidocaine HCl Local Soln Prefilled Syringe 100 MG/5ML (2%): INTRAMUSCULAR | Qty: 5 | Status: AC

## 2019-09-18 MED FILL — Potassium Chloride Inj 2 mEq/ML: INTRAVENOUS | Qty: 40 | Status: AC

## 2019-09-18 NOTE — Plan of Care (Signed)
  Problem: Education: Goal: Understanding of CV disease, CV risk reduction, and recovery process will improve Outcome: Progressing Goal: Individualized Educational Video(s) Outcome: Progressing   Problem: Activity: Goal: Ability to return to baseline activity level will improve Outcome: Progressing   Problem: Cardiovascular: Goal: Ability to achieve and maintain adequate cardiovascular perfusion will improve Outcome: Progressing   Problem: Health Behavior/Discharge Planning: Goal: Ability to safely manage health-related needs after discharge will improve Outcome: Progressing   Problem: Education: Goal: Knowledge of General Education information will improve Description: Including pain rating scale, medication(s)/side effects and non-pharmacologic comfort measures Outcome: Progressing   Problem: Health Behavior/Discharge Planning: Goal: Ability to manage health-related needs will improve Outcome: Progressing   Problem: Clinical Measurements: Goal: Ability to maintain clinical measurements within normal limits will improve Outcome: Progressing Goal: Will remain free from infection Outcome: Progressing Goal: Diagnostic test results will improve Outcome: Progressing Goal: Respiratory complications will improve Outcome: Progressing Goal: Cardiovascular complication will be avoided Outcome: Progressing   Problem: Activity: Goal: Risk for activity intolerance will decrease Outcome: Progressing   Problem: Nutrition: Goal: Adequate nutrition will be maintained Outcome: Progressing   Problem: Coping: Goal: Level of anxiety will decrease Outcome: Progressing   Problem: Elimination: Goal: Will not experience complications related to bowel motility Outcome: Progressing Goal: Will not experience complications related to urinary retention Outcome: Progressing   Problem: Pain Managment: Goal: General experience of comfort will improve Outcome: Progressing   Problem:  Safety: Goal: Ability to remain free from injury will improve Outcome: Progressing   Problem: Skin Integrity: Goal: Risk for impaired skin integrity will decrease Outcome: Progressing   

## 2019-09-18 NOTE — Progress Notes (Signed)
Patient ID: Russell Harris, male   DOB: 05/09/1966, 53 y.o.   MRN: [id number] TCTS Evening Rounds:  Hemodynamically stable in sinus rhythm.  sats 94% RA  Ambulated.  Urine output ok CT output low

## 2019-09-18 NOTE — Progress Notes (Signed)
    Pt is doing great. NSR Hemodynamics look good  Cont to follow  No new recs    Mertie Moores, MD  09/18/2019 8:26 AM    [address],  [place], [place]  [address] Pager [phone] Phone: [phone]; Fax: [phone]

## 2019-09-19 ENCOUNTER — Inpatient Hospital Stay (HOSPITAL_COMMUNITY): Payer: Medicare HMO

## 2019-09-19 LAB — BASIC METABOLIC PANEL
Anion gap: 8 (ref 5–15)
BUN: 13 mg/dL (ref 6–20)
CO2: 28 mmol/L (ref 22–32)
Calcium: 8.2 mg/dL — ABNORMAL LOW (ref 8.9–10.3)
Chloride: 101 mmol/L (ref 98–111)
Creatinine, Ser: 0.87 mg/dL (ref 0.61–1.24)
GFR calc Af Amer: >60 mL/min (ref >60)
GFR calc non Af Amer: >60 mL/min (ref >60)
Glucose, Bld: 91 mg/dL (ref 70–99)
Potassium: 3.9 mmol/L (ref 3.5–5.1)
Sodium: 137 mmol/L (ref 135–145)

## 2019-09-19 LAB — GLUCOSE, CAPILLARY
Glucose-Capillary: 111 mg/dL — ABNORMAL HIGH (ref 70–99)
Glucose-Capillary: 180 mg/dL — ABNORMAL HIGH (ref 70–99)
Glucose-Capillary: 88 mg/dL (ref 70–99)
Glucose-Capillary: 153 mg/dL — ABNORMAL HIGH (ref 70–99)
Glucose-Capillary: 92 mg/dL (ref 70–99)

## 2019-09-19 LAB — CBC WITH DIFFERENTIAL/PLATELET
Abs Immature Granulocytes: 0.08 10*3/uL — ABNORMAL HIGH (ref 0.00–0.07)
Basophils Absolute: 0 10*3/uL (ref 0.0–0.1)
Basophils Relative: 0 %
Eosinophils Absolute: 0.3 10*3/uL (ref 0.0–0.5)
Eosinophils Relative: 2 %
HCT: 23.3 % — ABNORMAL LOW (ref 39.0–52.0)
Hemoglobin: 7.7 g/dL — ABNORMAL LOW (ref 13.0–17.0)
Immature Granulocytes: 1 %
Lymphocytes Relative: 20 %
Lymphs Abs: 2.8 10*3/uL (ref 0.7–4.0)
MCH: 32.4 pg (ref 26.0–34.0)
MCHC: 33 g/dL (ref 30.0–36.0)
MCV: 97.9 fL (ref 80.0–100.0)
Monocytes Absolute: 1.5 10*3/uL — ABNORMAL HIGH (ref 0.1–1.0)
Monocytes Relative: 10 %
Neutro Abs: 9.6 10*3/uL — ABNORMAL HIGH (ref 1.7–7.7)
Neutrophils Relative %: 67 %
Platelets: 95 10*3/uL — ABNORMAL LOW (ref 150–400)
RBC: 2.38 MIL/uL — ABNORMAL LOW (ref 4.22–5.81)
RDW: 13.6 % (ref 11.5–15.5)
WBC: 14.3 10*3/uL — ABNORMAL HIGH (ref 4.0–10.5)
nRBC: 0 % (ref 0.0–0.2)

## 2019-09-19 MED ORDER — METOPROLOL TARTRATE 25 MG PO TABS
25.0000 mg | ORAL_TABLET | Freq: Two times a day (BID) | ORAL | Status: DC
  Administered 2019-09-19 – 2019-09-20 (×2): 25 mg via ORAL
  Filled 2019-09-19 (×3): qty 1

## 2019-09-19 MED ORDER — FUROSEMIDE 10 MG/ML IJ SOLN
40.0000 mg | Freq: Once | INTRAMUSCULAR | Status: AC
  Administered 2019-09-19: 40 mg via INTRAVENOUS
  Filled 2019-09-19: qty 4

## 2019-09-19 NOTE — Progress Notes (Signed)
Patient voided within 6hrs of foley removal. 175cc.

## 2019-09-19 NOTE — Progress Notes (Signed)
CARDIAC REHAB PHASE I   PRE:  Rate/Rhythm: 92 SR    BP: sitting 94/59    SaO2: 96 RA  MODE:  Ambulation: 740 ft   POST:  Rate/Rhythm: 118 ST    BP: sitting 103/61     SaO2: 96 RA  Pt moving very well. Able to get out of bed with min assist and verbal cues. [employer] hall x2 laps with [employer]. Steady, no c/o. To recliner. VSS. Will f/u tomorrow. [place], ACSM 09/19/2019 2:18 PM

## 2019-09-19 NOTE — Progress Notes (Signed)
Pt arrived from [facility] in wheelchair. C/O/x4. Pt given chg bath. Telebox 05 applied/ccmd notified. Pt vitals stable. Pt denies complaints. Pt oriented to room and call bell within reach. Will continue to monitor. Jerald Kief.

## 2019-09-19 NOTE — Progress Notes (Signed)
3 Days Post-Op Procedure(s) (LRB): CORONARY ARTERY BYPASS GRAFTING (CABG) x 3 WITH BILATERAL IMA'S AND RIGHT RADIAL ARTERY OPEN HARVESTING. (N/A) TRANSESOPHAGEAL ECHOCARDIOGRAM (TEE) (N/A) Subjective: Mild pain with ambulation  Objective: Vital signs in last 24 hours: Temp:  [98.1 F (36.7 C)-98.5 F (36.9 C)] 98.1 F (36.7 C) (09/25 0700) Pulse Rate:  [77-101] 101 (09/25 0800) Cardiac Rhythm: Normal sinus rhythm (09/25 0730) Resp:  [14-28] 22 (09/25 0800) BP: (90-115)/(55-71) 102/60 (09/25 0800) SpO2:  [88 %-96 %] 95 % (09/25 0800) Weight:  [63.8 kg] 63.8 kg (09/25 0600)  Hemodynamic parameters for last 24 hours:    Intake/Output from previous day: 09/24 0701 - 09/25 0700 In: 600 [P.O.:600] Out: 1230 [Urine:920; Chest Tube:310] Intake/Output this shift: Total I/O In: 240 [P.O.:240] Out: 80 [Urine:60; Chest Tube:20]  General appearance: alert, cooperative and no distress Neurologic: intact Heart: regular rate and rhythm, S1, S2 normal, no murmur, click, rub or gallop Lungs: clear to auscultation bilaterally Extremities: edema very mild Wound: dressed,dry  Lab Results: Recent Labs    09/18/19 0521 09/19/19 0357  WBC 14.8* 14.3*  HGB 8.7* 7.7*  HCT 24.9* 23.3*  PLT 77* 95*   BMET:  Recent Labs    09/18/19 0521 09/19/19 0357  NA 137 137  K 4.4 3.9  CL 102 101  CO2 28 28  GLUCOSE 104* 91  BUN 11 13  CREATININE 0.94 0.87  CALCIUM 8.6* 8.2*    PT/INR:  Recent Labs    09/16/19 1423  LABPROT 16.2*  INR 1.3*   ABG    Component Value Date/Time   PHART 7.342 (L) 09/16/2019 1941   HCO3 22.2 09/16/2019 1941   TCO2 23 09/16/2019 1941   ACIDBASEDEF 3.0 (H) 09/16/2019 1941   O2SAT 97.0 09/16/2019 1941   CBG (last 3)  Recent Labs    09/18/19 2327 09/19/19 0344 09/19/19 0731  GLUCAP 119* 111* 180*    Assessment/Plan: S/P Procedure(s) (LRB): CORONARY ARTERY BYPASS GRAFTING (CABG) x 3 WITH BILATERAL IMA'S AND RIGHT RADIAL ARTERY OPEN HARVESTING.  (N/A) TRANSESOPHAGEAL ECHOCARDIOGRAM (TEE) (N/A) Mobilize Diuresis Diabetes control d/c pacing wires d/c tubes/lines Plan for transfer to step-down: see transfer orders   LOS: 8 days    Wonda Olds 09/19/2019

## 2019-09-19 NOTE — Progress Notes (Signed)
    Pt is doing well Slightly tachycardic  Was on Imndur and ISDN  Have D/C'd ISDN and increased metoprolol to 25 BID  Overall doing well      Mertie Moores, MD  09/19/2019 8:19 AM    [facility] [address],  [place], [place]  [address] Pager [phone] Phone: [phone]; Fax: [phone]

## 2019-09-19 NOTE — Progress Notes (Signed)
CT and Pacing wires removed. All intact. Pt tolerated well.  No observable complications. Patient will remain on bedrest until 1200.

## 2019-09-19 NOTE — Progress Notes (Signed)
Patient transported to [facility]East room 5 via wheelchair. VSS. No visible distress. Report given to Sonia Baller, [place]. Chart given to Network engineer.

## 2019-09-19 NOTE — Progress Notes (Signed)
EVENING ROUNDS NOTE :     [address]       [place] [address]             [phone]                 3 Days Post-Op Procedure(s) (LRB): CORONARY ARTERY BYPASS GRAFTING (CABG) x 3 WITH BILATERAL IMA'S AND RIGHT RADIAL ARTERY OPEN HARVESTING. (N/A) TRANSESOPHAGEAL ECHOCARDIOGRAM (TEE) (N/A)   Total Length of Stay:  LOS: 8 days  Events:  Doing well Awaiting transfer    BP (!) 94/59   Pulse 84   Temp 98.7 F (37.1 C)   Resp 18   Ht 6' (1.829 m)   Wt 63.8 kg   SpO2 93%   BMI 19.08 kg/m           I/O last 3 completed shifts: In: 816.6 [P.O.:720; IV Piggyback:96.6] Out: 2120 [Urine:1600; Chest Tube:520]   CBC Latest Ref Rng & Units 09/19/2019 09/18/2019 09/17/2019  WBC 4.0 - 10.5 K/uL 14.3(H) 14.8(H) 14.0(H)  Hemoglobin 13.0 - 17.0 g/dL 7.7(L) 8.7(L) 9.0(L)  Hematocrit 39.0 - 52.0 % 23.3(L) 24.9(L) 26.6(L)  Platelets 150 - 400 K/uL 95(L) 77(L) 80(L)    BMP Latest Ref Rng & Units 09/19/2019 09/18/2019 09/17/2019  Glucose 70 - 99 mg/dL 91 104(H) 171(H)  BUN 6 - 20 mg/dL 13 11 13   Creatinine 0.61 - 1.24 mg/dL 0.87 0.94 0.98  Sodium 135 - 145 mmol/L 137 137 138  Potassium 3.5 - 5.1 mmol/L 3.9 4.4 5.1  Chloride 98 - 111 mmol/L 101 102 105  CO2 22 - 32 mmol/L 28 28 25   Calcium 8.9 - 10.3 mg/dL 8.2(L) 8.6(L) 8.4(L)    ABG    Component Value Date/Time   PHART 7.342 (L) 09/16/2019 1941   PCO2ART 40.6 09/16/2019 1941   PO2ART 89.0 09/16/2019 1941   HCO3 22.2 09/16/2019 1941   TCO2 23 09/16/2019 1941   ACIDBASEDEF 3.0 (H) 09/16/2019 1941   O2SAT 97.0 09/16/2019 1941       Melodie Bouillon, MD 09/19/2019 3:42 PM

## 2019-09-20 DIAGNOSIS — I471 Supraventricular tachycardia: Secondary | ICD-10-CM

## 2019-09-20 LAB — GLUCOSE, CAPILLARY
Glucose-Capillary: 106 mg/dL — ABNORMAL HIGH (ref 70–99)
Glucose-Capillary: 107 mg/dL — ABNORMAL HIGH (ref 70–99)
Glucose-Capillary: 119 mg/dL — ABNORMAL HIGH (ref 70–99)
Glucose-Capillary: 122 mg/dL — ABNORMAL HIGH (ref 70–99)
Glucose-Capillary: 130 mg/dL — ABNORMAL HIGH (ref 70–99)
Glucose-Capillary: 95 mg/dL (ref 70–99)

## 2019-09-20 MED ORDER — METOPROLOL TARTRATE 12.5 MG HALF TABLET
12.5000 mg | ORAL_TABLET | Freq: Once | ORAL | Status: AC
  Administered 2019-09-20: 12.5 mg via ORAL
  Filled 2019-09-20: qty 1

## 2019-09-20 MED ORDER — INSULIN ASPART 100 UNIT/ML ~~LOC~~ SOLN
0.0000 [IU] | Freq: Three times a day (TID) | SUBCUTANEOUS | Status: DC
  Administered 2019-09-20: 2 [IU] via SUBCUTANEOUS

## 2019-09-20 MED ORDER — METOPROLOL TARTRATE 25 MG PO TABS
37.5000 mg | ORAL_TABLET | Freq: Two times a day (BID) | ORAL | Status: DC
  Administered 2019-09-20 – 2019-09-21 (×2): 37.5 mg via ORAL
  Filled 2019-09-20 (×2): qty 1

## 2019-09-20 NOTE — Progress Notes (Signed)
Error

## 2019-09-20 NOTE — Progress Notes (Signed)
Patient with increase heart rate on monitor this AM to 189 patient stated he had used the urinal and sat back in bed patient did complaint of dizziness. Pt bp 119/73. EKG obtained and Jadene Pierini PAC on floor to see patient. Will monitor patient. Kelissa Merlin, Bettina Gavia RN

## 2019-09-20 NOTE — Plan of Care (Signed)
  Problem: Education: Goal: Knowledge of General Education information will improve Description Including pain rating scale, medication(s)/side effects and non-pharmacologic comfort measures Outcome: Progressing   Problem: Health Behavior/Discharge Planning: Goal: Ability to manage health-related needs will improve Outcome: Progressing   

## 2019-09-20 NOTE — Progress Notes (Signed)
Patient ambulated in hall with rolling walker approx. 500 feet. Tolerated well.

## 2019-09-20 NOTE — Progress Notes (Signed)
Progress Note  Patient Name: Russell Harris Date of Encounter: 09/20/2019  Primary Cardiologist:   No primary care provider on file.   Subjective   The patient did notice the palpitations this morning.  Feels his body shaking.  No pain.  No presyncope.   Inpatient Medications    Scheduled Meds: . acetaminophen  1,000 mg Oral Q6H   Or  . acetaminophen (TYLENOL) oral liquid 160 mg/5 mL  1,000 mg Per Tube Q6H  . aspirin EC  81 mg Oral Daily  . atorvastatin  80 mg Oral q1800  . bisacodyl  10 mg Oral Daily   Or  . bisacodyl  10 mg Rectal Daily  . Chlorhexidine Gluconate Cloth  6 each Topical Daily  . clopidogrel  75 mg Oral Daily  . colchicine  0.6 mg Oral Daily  . docusate sodium  200 mg Oral Daily  . feeding supplement (ENSURE ENLIVE)  237 mL Oral BID BM  . ferrous Q000111Q C-folic acid  1 capsule Oral BID PC  . insulin aspart  0-24 Units Subcutaneous Q4H  . magnesium oxide  400 mg Oral BID  . metoprolol tartrate  37.5 mg Oral BID  . multivitamin with minerals  1 tablet Oral Daily  . pantoprazole  40 mg Oral Daily  . sodium chloride flush  3 mL Intravenous Q12H   Continuous Infusions:  PRN Meds: metoprolol tartrate, ondansetron (ZOFRAN) IV, oxyCODONE   Vital Signs    Vitals:   09/20/19 0930 09/20/19 1000 09/20/19 1100 09/20/19 1200  BP: 113/73 117/74 107/67 (!) 103/56  Pulse:   88 82  Resp: 19 18 19 16   Temp:      TempSrc:      SpO2:   98% 96%  Weight:      Height:        Intake/Output Summary (Last 24 hours) at 09/20/2019 1301 Last data filed at 09/20/2019 0951 Gross per 24 hour  Intake 150 ml  Output 1775 ml  Net -1625 ml   Filed Weights   09/18/19 0500 09/19/19 0600 09/20/19 0442  Weight: 64.2 kg 63.8 kg 60.6 kg    Telemetry    NSR, runs of narrow complex regular tachycardia (SVT) - Personally Reviewed  ECG    NA - Personally Reviewed  Physical Exam   GEN: No acute distress.   Neck: No  JVD Cardiac: RRR, no murmurs, rubs, or  gallops.  Respiratory: Clear to auscultation bilaterally. GI: Soft, nontender, non-distended  MS: No  edema; No deformity. Neuro:  Nonfocal  Psych: Normal affect   Labs    Chemistry Recent Labs  Lab 09/15/19 1411  09/17/19 1825 09/18/19 0521 09/19/19 0357  NA 139   < > 138 137 137  K 4.3   < > 5.1 4.4 3.9  CL 106   < > 105 102 101  CO2 23   < > 25 28 28   GLUCOSE 83   < > 171* 104* 91  BUN 13   < > 13 11 13   CREATININE 0.94   < > 0.98 0.94 0.87  CALCIUM 9.5   < > 8.4* 8.6* 8.2*  PROT 6.9  --   --   --   --   ALBUMIN 4.0  --   --   --   --   AST 17  --   --   --   --   ALT 14  --   --   --   --  ALKPHOS 53  --   --   --   --   BILITOT 1.0  --   --   --   --   GFRNONAA >60   < > >60 >60 >60  GFRAA >60   < > >60 >60 >60  ANIONGAP 10   < > 8 7 8    < > = values in this interval not displayed.     Hematology Recent Labs  Lab 09/17/19 1825 09/18/19 0521 09/19/19 0357  WBC 14.0* 14.8* 14.3*  RBC 2.77* 2.61* 2.38*  HGB 9.0* 8.7* 7.7*  HCT 26.6* 24.9* 23.3*  MCV 96.0 95.4 97.9  MCH 32.5 33.3 32.4  MCHC 33.8 34.9 33.0  RDW 13.9 13.9 13.6  PLT 80* 77* 95*    Cardiac EnzymesNo results for input(s): TROPONINI in the last 168 hours. No results for input(s): TROPIPOC in the last 168 hours.   BNPNo results for input(s): BNP, PROBNP in the last 168 hours.   DDimer No results for input(s): DDIMER in the last 168 hours.   Radiology    Dg Chest Port 1 View  Result Date: 09/19/2019 CLINICAL DATA:  Chest tube EXAM: PORTABLE CHEST 1 VIEW COMPARISON:  Yesterday FINDINGS: Chest tubes in place. Small biapical pneumothorax, unchanged. Right IJ sheath in stable position. Stable heart size. Stable atelectasis and right pleural fluid. IMPRESSION: Unchanged small biapical pneumothorax. Stable atelectasis and right pleural fluid. Electronically Signed   By: Monte Fantasia M.D.   On: 09/19/2019 07:12    Cardiac Studies   ECHO:  1. Left ventricular ejection fraction, by visual  estimation, is 50 to 55%. The left ventricle has mildly decreased function. Normal left ventricular size. There is no left ventricular hypertrophy. False tendon in the left ventricle. 2. Basal and mid inferolateral wall is abnormal. 3. Elevated left ventricular end-diastolic pressure. 4. Left ventricular diastolic Doppler parameters are consistent with impaired relaxation pattern of LV diastolic filling. 5. Global right ventricle has mildly reduced systolic function.The right ventricular size is mildly enlarged. No increase in right ventricular wall thickness. 6. Left atrial size was normal. 7. Right atrial size was normal. 8. The mitral valve is normal in structure. No evidence of mitral valve regurgitation. No evidence of mitral stenosis. 9. The tricuspid valve is normal in structure. Tricuspid valve regurgitation was not visualized by color flow Doppler. 10. The aortic valve is normal in structure. Aortic valve regurgitation was not visualized by color flow Doppler. Structurally normal aortic valve, with no evidence of sclerosis or stenosis. 11. The pulmonic valve was normal in structure. Pulmonic valve regurgitation is not visualized by color flow Doppler. 12. The inferior vena cava is normal in size with greater than 50% respiratory variability, suggesting right atrial pressure of 3 mmHg.   Patient Profile     53 y.o. male with a history of Hodgkin's lymphoma, COPD, tobacco abuse he was admitted to [facility] and was found to have a positive stress test.  He was transferred to [facility] for heart catheterization.  He was found to have multivessel disease. Dr. Claiborne Billings recommended surgical consultation for CABG.  Assessment & Plan    CAD:  Now status post CABG.    HTN:  BP running low but stable.   SVT:  Runs of SVT.  Beta blocker started yesterday but he only received one dose.   Agree with plans for higher dosing today.  We will follow with you for further  suggestions.   For questions or updates, please  contact [place] Please consult [email or web address] for contact info under Cardiology/STEMI.   Signed, Minus Breeding, MD  09/20/2019, 1:01 PM

## 2019-09-20 NOTE — Progress Notes (Addendum)
[address]       [place] [address]             [phone]      4 Days Post-Op Procedure(s) (LRB): CORONARY ARTERY BYPASS GRAFTING (CABG) x 3 WITH BILATERAL IMA'S AND RIGHT RADIAL ARTERY OPEN HARVESTING. (N/A) TRANSESOPHAGEAL ECHOCARDIOGRAM (TEE) (N/A) Subjective: Had episode of SVT with HR into 190's, has calmed down to low 100's  Objective: Vital signs in last 24 hours: Temp:  [97 F (36.1 C)-98.9 F (37.2 C)] 98.3 F (36.8 C) (09/26 0442) Pulse Rate:  [84-97] 91 (09/26 0442) Cardiac Rhythm: Normal sinus rhythm (09/26 0847) Resp:  [13-24] 22 (09/26 0853) BP: (94-119)/(59-81) 119/73 (09/26 0853) SpO2:  [93 %-97 %] 97 % (09/26 0442) Weight:  [60.6 kg] 60.6 kg (09/26 0442)  Hemodynamic parameters for last 24 hours:    Intake/Output from previous day: 09/25 0701 - 09/26 0700 In: 480 [P.O.:480] Out: 3075 [Urine:3035; Chest Tube:40] Intake/Output this shift: No intake/output data recorded.  General appearance: alert, cooperative and no distress Heart: regular rate and rhythm and tachy, occas extrasystole Lungs: clear to auscultation bilaterally Abdomen: benign Extremities: no edema, right hand N/V intact Wound: incis healing well  Lab Results: Recent Labs    09/18/19 0521 09/19/19 0357  WBC 14.8* 14.3*  HGB 8.7* 7.7*  HCT 24.9* 23.3*  PLT 77* 95*   BMET:  Recent Labs    09/18/19 0521 09/19/19 0357  NA 137 137  K 4.4 3.9  CL 102 101  CO2 28 28  GLUCOSE 104* 91  BUN 11 13  CREATININE 0.94 0.87  CALCIUM 8.6* 8.2*    PT/INR: No results for input(s): LABPROT, INR in the last 72 hours. ABG    Component Value Date/Time   PHART 7.342 (L) 09/16/2019 1941   HCO3 22.2 09/16/2019 1941   TCO2 23 09/16/2019 1941   ACIDBASEDEF 3.0 (H) 09/16/2019 1941   O2SAT 97.0 09/16/2019 1941   CBG (last 3)  Recent Labs    09/20/19 0008 09/20/19 0441 09/20/19 0841  GLUCAP 106* 95 119*    Meds Scheduled Meds: . acetaminophen  1,000 mg  Oral Q6H   Or  . acetaminophen (TYLENOL) oral liquid 160 mg/5 mL  1,000 mg Per Tube Q6H  . aspirin EC  81 mg Oral Daily  . atorvastatin  80 mg Oral q1800  . bisacodyl  10 mg Oral Daily   Or  . bisacodyl  10 mg Rectal Daily  . Chlorhexidine Gluconate Cloth  6 each Topical Daily  . clopidogrel  75 mg Oral Daily  . colchicine  0.6 mg Oral Daily  . docusate sodium  200 mg Oral Daily  . feeding supplement (ENSURE ENLIVE)  237 mL Oral BID BM  . ferrous Q000111Q C-folic acid  1 capsule Oral BID PC  . insulin aspart  0-24 Units Subcutaneous Q4H  . magnesium oxide  400 mg Oral BID  . metoprolol tartrate  25 mg Oral BID  . multivitamin with minerals  1 tablet Oral Daily  . pantoprazole  40 mg Oral Daily  . sodium chloride flush  3 mL Intravenous Q12H   Continuous Infusions: PRN Meds:.metoprolol tartrate, ondansetron (ZOFRAN) IV, oxyCODONE  Xrays Dg Chest Port 1 View  Result Date: 09/19/2019 CLINICAL DATA:  Chest tube EXAM: PORTABLE CHEST 1 VIEW COMPARISON:  Yesterday FINDINGS: Chest tubes in place. Small biapical pneumothorax, unchanged. Right IJ sheath in stable position. Stable heart size. Stable atelectasis and right pleural fluid.  IMPRESSION: Unchanged small biapical pneumothorax. Stable atelectasis and right pleural fluid. Electronically Signed   By: Monte Fantasia M.D.   On: 09/19/2019 07:12    Assessment/Plan: S/P Procedure(s) (LRB): CORONARY ARTERY BYPASS GRAFTING (CABG) x 3 WITH BILATERAL IMA'S AND RIGHT RADIAL ARTERY OPEN HARVESTING. (N/A) TRANSESOPHAGEAL ECHOCARDIOGRAM (TEE) (N/A)  1 overall doing well 2 SVT, BP should tol increase in beta blocker, monitor closely 3 No new labs today- will recheck in am, getting Iron for ABL anemia 4 cont pulm toilet and rehab  LOS: 9 days    [facility] 09/20/2019 Pager [phone]  Agree with above SVT this am.  BB increased Ambulated this afternoon Home soon [employer]

## 2019-09-20 NOTE — Progress Notes (Signed)
CARDIAC REHAB PHASE I   PRE:  Rate/Rhythm: 79 SR    BP: sitting 104/63    SaO2: 99 RA  MODE:  Ambulation: 890 ft   POST:  Rate/Rhythm: 94 SR    BP: sitting 99/47     SaO2: 99 RA  Tolerated well with RW. He would like a RW for home. Steady, no c/o. No arrhymias. To recliner. Discussed sternal precautions, IS, smoking cessation, diet, ex, and CRPII. Will refer to [place]. He wants to quit smoking and tips/resources given. [place], ACSM 09/20/2019 2:25 PM

## 2019-09-20 NOTE — Progress Notes (Signed)
Patient ambulated in hallway with rolling walker. Will monitor patient. Savannah Erbe, Bettina Gavia RN

## 2019-09-21 DIAGNOSIS — Z23 Encounter for immunization: Secondary | ICD-10-CM | POA: Diagnosis not present

## 2019-09-21 LAB — CBC
HCT: 25.9 % — ABNORMAL LOW (ref 39.0–52.0)
Hemoglobin: 8.8 g/dL — ABNORMAL LOW (ref 13.0–17.0)
MCH: 32.6 pg (ref 26.0–34.0)
MCHC: 34 g/dL (ref 30.0–36.0)
MCV: 95.9 fL (ref 80.0–100.0)
Platelets: 222 10*3/uL (ref 150–400)
RBC: 2.7 MIL/uL — ABNORMAL LOW (ref 4.22–5.81)
RDW: 13.3 % (ref 11.5–15.5)
WBC: 12.8 10*3/uL — ABNORMAL HIGH (ref 4.0–10.5)
nRBC: 0 % (ref 0.0–0.2)

## 2019-09-21 LAB — BASIC METABOLIC PANEL
Anion gap: 11 (ref 5–15)
BUN: 16 mg/dL (ref 6–20)
CO2: 25 mmol/L (ref 22–32)
Calcium: 8.6 mg/dL — ABNORMAL LOW (ref 8.9–10.3)
Chloride: 100 mmol/L (ref 98–111)
Creatinine, Ser: 0.89 mg/dL (ref 0.61–1.24)
GFR calc Af Amer: >60 mL/min (ref >60)
GFR calc non Af Amer: >60 mL/min (ref >60)
Glucose, Bld: 89 mg/dL (ref 70–99)
Potassium: 3.5 mmol/L (ref 3.5–5.1)
Sodium: 136 mmol/L (ref 135–145)

## 2019-09-21 LAB — GLUCOSE, CAPILLARY: Glucose-Capillary: 87 mg/dL (ref 70–99)

## 2019-09-21 MED ORDER — ADULT MULTIVITAMIN W/MINERALS CH: 1.0000 | ORAL_TABLET | Freq: Every day | ORAL | Status: AC

## 2019-09-21 MED ORDER — METOPROLOL TARTRATE 37.5 MG PO TABS: 37.5000 mg | ORAL_TABLET | Freq: Two times a day (BID) | ORAL | 1 refills | Status: DC

## 2019-09-21 MED ORDER — OXYCODONE HCL 5 MG PO TABS: 5.0000 mg | ORAL_TABLET | Freq: Four times a day (QID) | ORAL | 0 refills | Status: AC | PRN

## 2019-09-21 MED ORDER — POTASSIUM CHLORIDE CRYS ER 20 MEQ PO TBCR
40.0000 meq | EXTENDED_RELEASE_TABLET | Freq: Once | ORAL | Status: AC
  Administered 2019-09-21: 40 meq via ORAL
  Filled 2019-09-21: qty 2

## 2019-09-21 MED ORDER — ASPIRIN 81 MG PO TBEC: 81.0000 mg | DELAYED_RELEASE_TABLET | Freq: Every day | ORAL | Status: DC

## 2019-09-21 MED ORDER — FE FUMARATE-B12-VIT C-FA-IFC PO CAPS: 1.0000 | ORAL_CAPSULE | Freq: Two times a day (BID) | ORAL | 1 refills | Status: DC

## 2019-09-21 MED ORDER — CLOPIDOGREL BISULFATE 75 MG PO TABS: 75.0000 mg | ORAL_TABLET | Freq: Every day | ORAL | 1 refills | Status: DC

## 2019-09-21 MED ORDER — ATORVASTATIN CALCIUM 80 MG PO TABS: 80.0000 mg | ORAL_TABLET | Freq: Every day | ORAL | 1 refills | Status: DC

## 2019-09-21 NOTE — Progress Notes (Addendum)
[address]       [place] [address]             [phone]      5 Days Post-Op Procedure(s) (LRB): CORONARY ARTERY BYPASS GRAFTING (CABG) x 3 WITH BILATERAL IMA'S AND RIGHT RADIAL ARTERY OPEN HARVESTING. (N/A) TRANSESOPHAGEAL ECHOCARDIOGRAM (TEE) (N/A) Subjective: Maintaining good control of HR Feels well, no specific c/o  Objective: Vital signs in last 24 hours: Temp:  [98.1 F (36.7 C)-98.7 F (37.1 C)] 98.7 F (37.1 C) (09/27 0500) Pulse Rate:  [79-99] 83 (09/27 0500) Cardiac Rhythm: Normal sinus rhythm (09/26 2000) Resp:  [13-25] 24 (09/27 0500) BP: (99-138)/(56-87) 100/58 (09/27 0500) SpO2:  [94 %-100 %] 99 % (09/27 0500) Weight:  [61.1 kg] 61.1 kg (09/27 0500)  Hemodynamic parameters for last 24 hours:    Intake/Output from previous day: 09/26 0701 - 09/27 0700 In: 150 [P.O.:150] Out: 650 [Urine:650] Intake/Output this shift: No intake/output data recorded.  General appearance: alert, cooperative and no distress Heart: regular rate and rhythm Lungs: clear to auscultation bilaterally Abdomen: benign Extremities: no edema Wound: incis healing well  Lab Results: Recent Labs    09/19/19 0357 09/21/19 0402  WBC 14.3* 12.8*  HGB 7.7* 8.8*  HCT 23.3* 25.9*  PLT 95* 222   BMET:  Recent Labs    09/19/19 0357 09/21/19 0402  NA 137 136  K 3.9 3.5  CL 101 100  CO2 28 25  GLUCOSE 91 89  BUN 13 16  CREATININE 0.87 0.89  CALCIUM 8.2* 8.6*    PT/INR: No results for input(s): LABPROT, INR in the last 72 hours. ABG    Component Value Date/Time   PHART 7.342 (L) 09/16/2019 1941   HCO3 22.2 09/16/2019 1941   TCO2 23 09/16/2019 1941   ACIDBASEDEF 3.0 (H) 09/16/2019 1941   O2SAT 97.0 09/16/2019 1941   CBG (last 3)  Recent Labs    09/20/19 1700 09/20/19 2109 09/21/19 0629  GLUCAP 107* 122* 87    Meds Scheduled Meds: . acetaminophen  1,000 mg Oral Q6H   Or  . acetaminophen (TYLENOL) oral liquid 160 mg/5 mL  1,000 mg  Per Tube Q6H  . aspirin EC  81 mg Oral Daily  . atorvastatin  80 mg Oral q1800  . bisacodyl  10 mg Oral Daily   Or  . bisacodyl  10 mg Rectal Daily  . Chlorhexidine Gluconate Cloth  6 each Topical Daily  . clopidogrel  75 mg Oral Daily  . colchicine  0.6 mg Oral Daily  . docusate sodium  200 mg Oral Daily  . feeding supplement (ENSURE ENLIVE)  237 mL Oral BID BM  . ferrous Q000111Q C-folic acid  1 capsule Oral BID PC  . insulin aspart  0-24 Units Subcutaneous TID AC & HS  . magnesium oxide  400 mg Oral BID  . metoprolol tartrate  37.5 mg Oral BID  . multivitamin with minerals  1 tablet Oral Daily  . pantoprazole  40 mg Oral Daily  . sodium chloride flush  3 mL Intravenous Q12H   Continuous Infusions: PRN Meds:.metoprolol tartrate, ondansetron (ZOFRAN) IV, oxyCODONE  Xrays No results found.  Assessment/Plan: S/P Procedure(s) (LRB): CORONARY ARTERY BYPASS GRAFTING (CABG) x 3 WITH BILATERAL IMA'S AND RIGHT RADIAL ARTERY OPEN HARVESTING. (N/A) TRANSESOPHAGEAL ECHOCARDIOGRAM (TEE) (N/A)  1 doing well 2 hemodyn stable with BP around 100, HR controlled 3 sats good on RA 4 K+ 3.5 will supplement this am 5 leukocytosis trend  improving- no fevers 6 H/H improved 7  Thrombocytopenia resolved 8 stable for d/c  LOS: 10 days    Russell Harris [facility] 09/21/2019 Pager [phone] Agree with above Home today [place]

## 2019-09-22 ENCOUNTER — Ambulatory Visit: Payer: Medicare HMO | Admitting: Cardiology

## 2019-09-22 ENCOUNTER — Telehealth: Payer: Self-pay

## 2019-09-22 MED ORDER — METOPROLOL TARTRATE 37.5 MG PO TABS: 25.0000 mg | ORAL_TABLET | Freq: Three times a day (TID) | ORAL | 1 refills | Status: DC

## 2019-09-22 NOTE — Telephone Encounter (Signed)
Metoprolol 25 mg TID #90/1 refill called into [facility] pharm

## 2019-09-24 ENCOUNTER — Other Ambulatory Visit: Payer: Self-pay | Admitting: Cardiothoracic Surgery

## 2019-09-24 DIAGNOSIS — Z951 Presence of aortocoronary bypass graft: Secondary | ICD-10-CM

## 2019-09-25 DIAGNOSIS — I2510 Atherosclerotic heart disease of native coronary artery without angina pectoris: Secondary | ICD-10-CM | POA: Diagnosis not present

## 2019-09-25 DIAGNOSIS — R636 Underweight: Secondary | ICD-10-CM | POA: Diagnosis not present

## 2019-09-25 DIAGNOSIS — Z951 Presence of aortocoronary bypass graft: Secondary | ICD-10-CM | POA: Diagnosis not present

## 2019-09-25 DIAGNOSIS — Z87891 Personal history of nicotine dependence: Secondary | ICD-10-CM | POA: Diagnosis not present

## 2019-09-25 DIAGNOSIS — Z8571 Personal history of Hodgkin lymphoma: Secondary | ICD-10-CM | POA: Diagnosis not present

## 2019-09-25 DIAGNOSIS — Z681 Body mass index (BMI) 19 or less, adult: Secondary | ICD-10-CM | POA: Diagnosis not present

## 2019-09-25 DIAGNOSIS — I252 Old myocardial infarction: Secondary | ICD-10-CM | POA: Diagnosis not present

## 2019-09-25 DIAGNOSIS — G8929 Other chronic pain: Secondary | ICD-10-CM | POA: Diagnosis not present

## 2019-09-25 DIAGNOSIS — M549 Dorsalgia, unspecified: Secondary | ICD-10-CM | POA: Diagnosis not present

## 2019-09-26 ENCOUNTER — Ambulatory Visit
Admission: RE | Admit: 2019-09-26 | Discharge: 2019-09-26 | Disposition: A | Discharge status: Home / Self Care | Payer: Medicare HMO | Source: Ambulatory Visit | Attending: Cardiothoracic Surgery | Admitting: Cardiothoracic Surgery

## 2019-09-26 ENCOUNTER — Ambulatory Visit (INDEPENDENT_AMBULATORY_CARE_PROVIDER_SITE_OTHER): Payer: Self-pay | Admitting: Cardiothoracic Surgery

## 2019-09-26 ENCOUNTER — Encounter: Payer: Self-pay | Admitting: Cardiothoracic Surgery

## 2019-09-26 ENCOUNTER — Ambulatory Visit: Payer: Self-pay | Admitting: Cardiothoracic Surgery

## 2019-09-26 ENCOUNTER — Ambulatory Visit: Payer: Medicare HMO | Admitting: Cardiology

## 2019-09-26 ENCOUNTER — Ambulatory Visit (INDEPENDENT_AMBULATORY_CARE_PROVIDER_SITE_OTHER): Payer: Medicare HMO | Admitting: Cardiology

## 2019-09-26 ENCOUNTER — Other Ambulatory Visit: Payer: Self-pay

## 2019-09-26 ENCOUNTER — Encounter: Payer: Self-pay | Admitting: Cardiology

## 2019-09-26 VITALS — BP 90/60 | HR 63 | Ht 77.0 in | Wt 139.0 lb

## 2019-09-26 VITALS — BP 103/62 | HR 67 | Resp 18 | Ht 72.0 in | Wt 133.9 lb

## 2019-09-26 DIAGNOSIS — Z720 Tobacco use: Secondary | ICD-10-CM

## 2019-09-26 DIAGNOSIS — I2510 Atherosclerotic heart disease of native coronary artery without angina pectoris: Secondary | ICD-10-CM

## 2019-09-26 DIAGNOSIS — Z951 Presence of aortocoronary bypass graft: Secondary | ICD-10-CM

## 2019-09-26 DIAGNOSIS — R918 Other nonspecific abnormal finding of lung field: Secondary | ICD-10-CM | POA: Diagnosis not present

## 2019-09-26 NOTE — Progress Notes (Signed)
      [address]       [place] [address]             [phone]     CARDIOTHORACIC SURGERY OFFICE NOTE  Referring Provider is Troy Sine, MD Primary Cardiologist is No primary care provider on file. PCP is Street, Sharon Mt, MD   HPI: S/p CABG presents for 1st outpatient visit since discharge from [facility]. Doing well; no complaints or reports of angina or SOB.    Current Outpatient Medications  Medication Sig Dispense Refill  . acetaminophen (TYLENOL) 325 MG tablet Take 650 mg by mouth every 6 (six) hours as needed for mild pain or headache.    Marland Kitchen aspirin EC 81 MG EC tablet Take 1 tablet (81 mg total) by mouth daily.    Marland Kitchen atorvastatin (LIPITOR) 80 MG tablet Take 1 tablet (80 mg total) by mouth daily at 6 PM. 30 tablet 1  . clopidogrel (PLAVIX) 75 MG tablet Take 1 tablet (75 mg total) by mouth daily. 30 tablet 1  . Metoprolol Tartrate 37.5 MG TABS Take 25 mg by mouth 3 (three) times daily. (Patient taking differently: Take 25 mg by mouth 2 (two) times daily. ) 90 tablet 1  . Multiple Vitamin (MULTIVITAMIN WITH MINERALS) TABS tablet Take 1 tablet by mouth daily.    Marland Kitchen oxyCODONE (OXY IR/ROXICODONE) 5 MG immediate release tablet Take 1 tablet (5 mg total) by mouth every 6 (six) hours as needed for up to 7 days for severe pain. 28 tablet 0   No current facility-administered medications for this visit.       Physical Exam:   BP 103/62   Pulse 67   Resp 18   Ht 6' (1.829 m)   Wt 60.7 kg   SpO2 96%   BMI 18.16 kg/m   General:  Well-appearing, NAD  Chest:   cta  CV:   rrr  Incisions:  C/d/i  Abdomen:  sntnd  Extremities:  No edema  Diagnostic Tests: CXR with clear lung fields   Impression:  Doing well after CABG; continue present management  Plan:  F/u with Dr. Bettina Gavia today F/u with this office in 3-4 weeks  I spent in excess of 15 minutes during the conduct of this office consultation and >50% of this time involved direct face-to-face  encounter with the patient for counseling and/or coordination of their care.  Level 2                 10 minutes Level 3                 15 minutes Level 4                 25 minutes Level 5                 40 minutes  B. Murvin Natal, MD 09/26/2019 10:17 AM

## 2019-09-26 NOTE — Progress Notes (Signed)
Cardiology Office Note:    Date:  09/26/2019   ID:  Russell Harris, DOB Jul 23, 1966, MRN [id number]  PCP:  Street, Sharon Mt, MD  Cardiologist:  Berniece Salines, DO  Electrophysiologist:  None   Referring MD: Street, Sharon Mt, *   Chief Complaint  Patient presents with  . Hospitalization Follow-up    History of Present Illness:    Russell Harris is a 53 y.o. male with a hx of hypertension, coronary disease status post CABG x3 due to multivessel disease on September 16, 2019, Hodgkin lymphoma, COPD, former tobacco abuser.  The patient is here today for an initial visit to establish cardiac care.    He reports that he has been doing well post surgery.  He denies any chest pain, shortness of breath, any erythema around the wound and admits that he has been taking his medicine as prescribed.  He just saw his cardiothoracic surgeon this morning and due to hypotension his Lopressor was decreased from 25 mg 3 times a day to 25 mg twice daily.    Past Medical History:  Diagnosis Date  . COPD (chronic obstructive pulmonary disease) ([place])   . Hodgkin lymphoma ([place])    1997, s/p chemo  . RBBB   . Tobacco abuse     Past Surgical History:  Procedure Laterality Date  . CORONARY ARTERY BYPASS GRAFT N/A 09/16/2019   Procedure: CORONARY ARTERY BYPASS GRAFTING (CABG) x 3 WITH BILATERAL IMA'S AND RIGHT RADIAL ARTERY OPEN HARVESTING.;  Surgeon: Wonda Olds, MD;  Location: [facility];  Service: Open Heart Surgery;  Laterality: N/A;  . LEFT HEART CATH AND CORONARY ANGIOGRAPHY N/A 09/12/2019   Procedure: LEFT HEART CATH AND CORONARY ANGIOGRAPHY;  Surgeon: Troy Sine, MD;  Location: [facility];  Service: Cardiovascular;  Laterality: N/A;  . LYMPH NODE BIOPSY     of neck, diagnosed with hodgkin's lymphoma in 1997, s/p 6 month of chemo  . TEE WITHOUT CARDIOVERSION N/A 09/16/2019   Procedure: TRANSESOPHAGEAL ECHOCARDIOGRAM (TEE);  Surgeon: Wonda Olds, MD;  Location: [facility];  Service:  Open Heart Surgery;  Laterality: N/A;    Current Medications: Current Meds  Medication Sig  . acetaminophen (TYLENOL) 325 MG tablet Take 650 mg by mouth every 6 (six) hours as needed for mild pain or headache.  Marland Kitchen aspirin EC 81 MG EC tablet Take 1 tablet (81 mg total) by mouth daily.  Marland Kitchen atorvastatin (LIPITOR) 80 MG tablet Take 1 tablet (80 mg total) by mouth daily at 6 PM.  . clopidogrel (PLAVIX) 75 MG tablet Take 1 tablet (75 mg total) by mouth daily.  . Metoprolol Tartrate 37.5 MG TABS Take 25 mg by mouth 3 (three) times daily. (Patient taking differently: Take 25 mg by mouth 2 (two) times daily. )  . Multiple Vitamin (MULTIVITAMIN WITH MINERALS) TABS tablet Take 1 tablet by mouth daily.  Marland Kitchen oxyCODONE (OXY IR/ROXICODONE) 5 MG immediate release tablet Take 1 tablet (5 mg total) by mouth every 6 (six) hours as needed for up to 7 days for severe pain.     Allergies:   Ketorolac and Tramadol   Social History   Socioeconomic History  . Marital status: Single    Spouse name: Not on file  . Number of children: Not on file  . Years of education: Not on file  . Highest education level: Not on file  Occupational History  . Not on file  Social Needs  . Financial resource strain: Not on file  .  Food insecurity    Worry: Not on file    Inability: Not on file  . Transportation needs    Medical: Not on file    Non-medical: Not on file  Tobacco Use  . Smoking status: Former Smoker    Types: Cigarettes    Quit date: 09/10/2019    Years since quitting: 0.0  . Smokeless tobacco: Never Used  Substance and Sexual Activity  . Alcohol use: Yes    Comment: rarely  . Drug use: Yes    Types: Marijuana  . Sexual activity: Not on file  Lifestyle  . Physical activity    Days per week: Not on file    Minutes per session: Not on file  . Stress: Not on file  Relationships  . Social Herbalist on phone: Not on file    Gets together: Not on file    Attends religious service: Not on  file    Active member of club or organization: Not on file    Attends meetings of clubs or organizations: Not on file    Relationship status: Not on file  Other Topics Concern  . Not on file  Social History Narrative  . Not on file     Family History: The patient's family history includes Aneurysm in his father; Congestive Heart Failure in his mother; Heart attack in his brother and sister.  ROS:   Review of Systems  Constitution: Negative for decreased appetite, fever and weight gain.  HENT: Negative for congestion, ear discharge, hoarse voice and sore throat.   Eyes: Negative for discharge, redness, vision loss in right eye and visual halos.  Cardiovascular: Negative for chest pain, dyspnea on exertion, leg swelling, orthopnea and palpitations.  Respiratory: Negative for cough, hemoptysis, shortness of breath and snoring.   Endocrine: Negative for heat intolerance and polyphagia.  Hematologic/Lymphatic: Negative for bleeding problem. Does not bruise/bleed easily.  Skin: Negative for flushing, nail changes, rash and suspicious lesions.  Musculoskeletal: Negative for arthritis, joint pain, muscle cramps, myalgias, neck pain and stiffness.  Gastrointestinal: Negative for abdominal pain, bowel incontinence, diarrhea and excessive appetite.  Genitourinary: Negative for decreased libido, genital sores and incomplete emptying.  Neurological: Negative for brief paralysis, focal weakness, headaches and loss of balance.  Psychiatric/Behavioral: Negative for altered mental status, depression and suicidal ideas.  Allergic/Immunologic: Negative for HIV exposure and persistent infections.    EKGs/Labs/Other Studies Reviewed:    The following studies were reviewed today:   EKG:  The ekg from 09/21/2019 report sinus tachycardia heart rate 107 bpm with occasional PVCs.  Transesophageal echocardiogram 09/16/2019  POST-OP IMPRESSIONS Overall, there were no significant changes from pre-bypass.   PRE-OP FINDINGS  Left Ventricle: The left ventricle has mildly reduced systolic function, with an ejection fraction of 45-50% 50%. The cavity size was mildly dilated. There is no increase in left ventricular wall thickness. Left ventrical global hypokinesis without  regional wall motion abnormalities.  Right Ventricle: The right ventricle has normal systolic function. The cavity was normal. There is no increase in right ventricular wall thickness.  Left Atrium: Left atrial size was normal in size. The left atrial appendage is well visualized and there is no evidence of thrombus present.  Right Atrium: Right atrial size was normal in size. Right atrial pressure is estimated at 10 mmHg.  Interatrial Septum: No atrial level shunt detected by color flow Doppler.  Pericardium: There is no evidence of pericardial effusion.  Mitral Valve: The mitral valve is normal in  structure. Mild thickening of the mitral valve leaflet. Mild calcification of the mitral valve leaflet. Mitral valve regurgitation is trivial by color flow Doppler.  Tricuspid Valve: The tricuspid valve was normal in structure. Tricuspid valve regurgitation is trivial by color flow Doppler.  Aortic Valve: The aortic valve is normal in structure. Aortic valve regurgitation was not visualized by color flow Doppler. There is no stenosis of the aortic valve.  Pulmonic Valve: The pulmonic valve was normal in structure. Pulmonic valve regurgitation is trivial by color flow Doppler. Aorta: The aortic root and ascending aorta are normal in size and structure.  LHC    Mid RCA lesion is 20% stenosed.  Prox RCA lesion is 20% stenosed.  Ost Cx to Prox Cx lesion is 50% stenosed.  Prox Cx lesion is 50% stenosed.  1st Mrg lesion is 50% stenosed.  1st Diag lesion is 90% stenosed.  Prox LAD to Mid LAD lesion is 70% stenosed.  Mid LAD to Dist LAD lesion is 99% stenosed with 99% stenosed side branch in 2nd Diag.    Multivessel CAD with moderate coronary calcification and evidence for diffuse chronic appearing long subtotal LAD stenosis with 90% ostial narrowing in the proximal diagonal vessel and subtotal second diagonal stenosis.  There are collaterals septal perforating arteries from the day supplying the mid distal vessel as well as collateralization from the circumflex marginal branch apically.  The circumflex vessel appears to have an ulcerated proximal plaque with residual stenosis of 50% followed by 50% bifurcation stenosis of the circumflex at the takeoff of the OM vessel and ostially involving the OM vessel.  The RCA is a dominant vessel with mild luminal irregularity and mid stenoses of 20%.  There is significant septal collateralization to the mid distal LAD.  Low normal global LV contractility with suggestion of possible distal anterolateral hypocontractility.  EF estimate at 50 to 55%.  LVEDP 9 mm.  Recent Labs: 09/15/2019: ALT 14 09/17/2019: Magnesium 1.8 09/21/2019: BUN 16; Creatinine, Ser 0.89; Hemoglobin 8.8; Platelets 222; Potassium 3.5; Sodium 136  Recent Lipid Panel    Component Value Date/Time   CHOL 191 09/12/2019 0246   TRIG 55 09/12/2019 0246   HDL 51 09/12/2019 0246   CHOLHDL 3.7 09/12/2019 0246   VLDL 11 09/12/2019 0246   LDLCALC 129 (H) 09/12/2019 0246    Physical Exam:    VS:  BP 90/60   Pulse 63   Ht 6\' 5"  (1.956 m)   Wt 139 lb (63 kg)   SpO2 98%   BMI 16.48 kg/m     Wt Readings from Last 3 Encounters:  09/26/19 139 lb (63 kg)  09/26/19 133 lb 14.4 oz (60.7 kg)  09/21/19 134 lb 9.6 oz (61.1 kg)     GEN: Well nourished, well developed in no acute distress HEENT: Normal NECK: No JVD; No carotid bruits LYMPHATICS: No lymphadenopathy CARDIAC: S1S2 noted,RRR, no murmurs, rubs, gallops RESPIRATORY:  Clear to auscultation without rales, wheezing or rhonchi  ABDOMEN: Soft, non-tender, non-distended, +bowel sounds, no guarding. EXTREMITIES: No edema, No  cyanosis, no clubbing MUSCULOSKELETAL:  No edema; No deformity  SKIN: Warm and dry, chest wound, upper extremity wound looks clean and dry no erythema. NEUROLOGIC:  Alert and oriented x 3, non-focal PSYCHIATRIC:  Normal affect, good insight  ASSESSMENT:    1. Coronary artery disease involving native coronary artery of native heart without angina pectoris   2. S/P CABG x 3   3. Tobacco abuse    PLAN:    1.  Mr. Masin appears to be doing well post surgery.  He is compliant with his medication.  He is also completely quit smoking and does not have the urge to restart this.  For now he will continue on aspirin 81 mg daily, Plavix 75 mg daily, metoprolol 25 mg twice a day, statin 80 mg daily.  Blood work for his lipid profile will be performed at his next visit, and if needed Zetia will be added to his medical regimen.  He is hypotensive in the office today, but his metoprolol has already been decreased by his surgeon prior to presentation.  He will be buying a blood pressure cuff/monitor for home use and well take his blood pressure daily and will call if his blood pressure continue to stay with the systolic blood pressure in the 90s.  2.  I have educated patient on importance of continuing to take his medications as it relates to his cardiovascular health.   3.  He is ready enrolled in cardiac rehab and had his first session on Monday, September 29, 2019.  He wants to do well, and he has been walking around his neighborhood for at least 30 minutes a day.  4.  The patient was counseled on continuing tobacco cessation today for 5 minutes.  Counseling included reviewing the risks of smoking tobacco products, how it impacts the patient's current medical diagnoses and different strategies for quitting.  Pharmacotherapy to aid in tobacco cessation was not prescribed today. The patient coordinate with  primary care provider.  The patient was also advised to call  1-800-QUIT-NOW 959-470-7167) for additional  help with quitting smoking.   The patient is in agreement with the above plan. The patient left the office in stable condition.  The patient will follow up in 1 month.   Medication Adjustments/Labs and Tests Ordered: Current medicines are reviewed at length with the patient today.  Concerns regarding medicines are outlined above.  Orders Placed This Encounter  Procedures  . Lipid Profile   No orders of the defined types were placed in this encounter.   Patient Instructions  Medication Instructions:  Your physician recommends that you continue on your current medications as directed. Please refer to the Current Medication list given to you today.  If you need a refill on your cardiac medications before your next appointment, please call your pharmacy.   Lab work: Your physician recommends that you return for lab work in:  34month LIPID  If you have labs (blood work) drawn today and your tests are completely normal, you will receive your results only by: Marland Kitchen MyChart Message (if you have MyChart) OR . A paper copy in the mail If you have any lab test that is abnormal or we need to change your treatment, we will call you to review the results.  Testing/Procedures: None  Follow-Up: At [facility], you and your health needs are our priority.  As part of our continuing mission to provide you with exceptional heart care, we have created designated Provider Care Teams.  These Care Teams include your primary Cardiologist (physician) and Advanced Practice Providers (APPs -  Physician Assistants and Nurse Practitioners) who all work together to provide you with the care you need, when you need it. You will need a follow up appointment in 1 months with Dr Harriet Masson Any Other Special Instructions Will Be Listed Below (If Applicable).  Continue with cardiac rehab    Adopting a Healthy Lifestyle.  Know what a healthy weight is for you (  roughly BMI <25) and aim to maintain this   Aim for 7+  servings of fruits and vegetables daily   65-80+ fluid ounces of water or unsweet tea for healthy kidneys   Limit to max 1 drink of alcohol per day; avoid smoking/tobacco   Limit animal fats in diet for cholesterol and heart health - choose grass fed whenever available   Avoid highly processed foods, and foods high in saturated/trans fats   Aim for low stress - take time to unwind and care for your mental health   Aim for 150 min of moderate intensity exercise weekly for heart health, and weights twice weekly for bone health   Aim for 7-9 hours of sleep daily   When it comes to diets, agreement about the perfect plan isnt easy to find, even among the experts. Experts at the [employer] developed an idea known as the Healthy Eating Plate. Just imagine a plate divided into logical, healthy portions.   The emphasis is on diet quality:   Load up on vegetables and fruits - one-half of your plate: Aim for color and variety, and remember that potatoes dont count.   Go for whole grains - one-quarter of your plate: Whole wheat, barley, wheat berries, quinoa, oats, brown rice, and foods made with them. If you want pasta, go with whole wheat pasta.   Protein power - one-quarter of your plate: Fish, chicken, beans, and nuts are all healthy, versatile protein sources. Limit red meat.   The diet, however, does go beyond the plate, offering a few other suggestions.   Use healthy plant oils, such as olive, canola, soy, corn, sunflower and peanut. Check the labels, and avoid partially hydrogenated oil, which have unhealthy trans fats.   If youre thirsty, drink water. Coffee and tea are good in moderation, but skip sugary drinks and limit milk and dairy products to one or two daily servings.   The type of carbohydrate in the diet is more important than the amount. Some sources of carbohydrates, such as vegetables, fruits, whole grains, and beans-are healthier than others.    Finally, stay active  Signed, Berniece Salines, DO  09/26/2019 10:54 AM    [facility]

## 2019-09-26 NOTE — Patient Instructions (Signed)
Medication Instructions:  Your physician recommends that you continue on your current medications as directed. Please refer to the Current Medication list given to you today.  If you need a refill on your cardiac medications before your next appointment, please call your pharmacy.   Lab work: Your physician recommends that you return for lab work in:  1month LIPID  If you have labs (blood work) drawn today and your tests are completely normal, you will receive your results only by: Marland Kitchen MyChart Message (if you have MyChart) OR . A paper copy in the mail If you have any lab test that is abnormal or we need to change your treatment, we will call you to review the results.  Testing/Procedures: None  Follow-Up: At [facility], you and your health needs are our priority.  As part of our continuing mission to provide you with exceptional heart care, we have created designated Provider Care Teams.  These Care Teams include your primary Cardiologist (physician) and Advanced Practice Providers (APPs -  Physician Assistants and Nurse Practitioners) who all work together to provide you with the care you need, when you need it. You will need a follow up appointment in 1 months with Dr Harriet Masson Any Other Special Instructions Will Be Listed Below (If Applicable).  Continue with cardiac rehab

## 2019-10-04 DIAGNOSIS — B349 Viral infection, unspecified: Secondary | ICD-10-CM | POA: Diagnosis not present

## 2019-10-04 DIAGNOSIS — J029 Acute pharyngitis, unspecified: Secondary | ICD-10-CM | POA: Diagnosis not present

## 2019-10-04 DIAGNOSIS — Z951 Presence of aortocoronary bypass graft: Secondary | ICD-10-CM | POA: Diagnosis not present

## 2019-10-04 DIAGNOSIS — Z03818 Encounter for observation for suspected exposure to other biological agents ruled out: Secondary | ICD-10-CM | POA: Diagnosis not present

## 2019-10-07 DIAGNOSIS — J309 Allergic rhinitis, unspecified: Secondary | ICD-10-CM | POA: Diagnosis not present

## 2019-10-07 DIAGNOSIS — Z20828 Contact with and (suspected) exposure to other viral communicable diseases: Secondary | ICD-10-CM | POA: Diagnosis not present

## 2019-10-07 DIAGNOSIS — Z681 Body mass index (BMI) 19 or less, adult: Secondary | ICD-10-CM | POA: Diagnosis not present

## 2019-10-07 DIAGNOSIS — J069 Acute upper respiratory infection, unspecified: Secondary | ICD-10-CM | POA: Diagnosis not present

## 2019-10-24 ENCOUNTER — Other Ambulatory Visit: Payer: Self-pay

## 2019-10-24 ENCOUNTER — Ambulatory Visit (INDEPENDENT_AMBULATORY_CARE_PROVIDER_SITE_OTHER): Payer: Self-pay | Admitting: Cardiothoracic Surgery

## 2019-10-24 VITALS — BP 124/84 | HR 62 | Temp 98.1°F | Resp 20 | Ht 72.0 in | Wt 143.0 lb

## 2019-10-24 DIAGNOSIS — Z951 Presence of aortocoronary bypass graft: Secondary | ICD-10-CM

## 2019-10-24 DIAGNOSIS — I2510 Atherosclerotic heart disease of native coronary artery without angina pectoris: Secondary | ICD-10-CM

## 2019-10-27 ENCOUNTER — Ambulatory Visit (INDEPENDENT_AMBULATORY_CARE_PROVIDER_SITE_OTHER): Payer: Medicare HMO | Admitting: Cardiology

## 2019-10-27 ENCOUNTER — Encounter: Payer: Self-pay | Admitting: Cardiology

## 2019-10-27 ENCOUNTER — Other Ambulatory Visit: Payer: Self-pay

## 2019-10-27 VITALS — BP 104/60 | HR 75 | Ht 72.0 in | Wt 144.0 lb

## 2019-10-27 DIAGNOSIS — I2510 Atherosclerotic heart disease of native coronary artery without angina pectoris: Secondary | ICD-10-CM | POA: Diagnosis not present

## 2019-10-27 DIAGNOSIS — Z951 Presence of aortocoronary bypass graft: Secondary | ICD-10-CM | POA: Diagnosis not present

## 2019-10-27 DIAGNOSIS — I10 Essential (primary) hypertension: Secondary | ICD-10-CM

## 2019-10-27 DIAGNOSIS — Z87891 Personal history of nicotine dependence: Secondary | ICD-10-CM | POA: Diagnosis not present

## 2019-10-27 DIAGNOSIS — Z720 Tobacco use: Secondary | ICD-10-CM | POA: Diagnosis not present

## 2019-10-27 DIAGNOSIS — E782 Mixed hyperlipidemia: Secondary | ICD-10-CM

## 2019-10-27 LAB — LIPID PANEL
Chol/HDL Ratio: 2.5 ratio (ref 0.0–5.0)
Cholesterol, Total: 137 mg/dL (ref 100–199)
HDL: 55 mg/dL (ref >39)
LDL Chol Calc (NIH): 69 mg/dL (ref 0–99)
Triglycerides: 64 mg/dL (ref 0–149)
VLDL Cholesterol Cal: 13 mg/dL (ref 5–40)

## 2019-10-27 MED ORDER — METOPROLOL TARTRATE 25 MG PO TABS: 25.0000 mg | ORAL_TABLET | Freq: Two times a day (BID) | ORAL | 1 refills | Status: DC

## 2019-10-27 NOTE — Patient Instructions (Signed)
Medication Instructions:  Your physician has recommended you make the following change in your medication:  DECREASE: Metoprolol to 25 mg Take 1 tab twice daily  *If you need a refill on your cardiac medications before your next appointment, please call your pharmacy*  Lab Work: Your physician recommends that you return for lab work in: [place]   If you have labs (blood work) drawn today and your tests are completely normal, you will receive your results only by: Marland Kitchen MyChart Message (if you have MyChart) OR . A paper copy in the mail If you have any lab test that is abnormal or we need to change your treatment, we will call you to review the results.  Testing/Procedures: None  Follow-Up: At [facility], you and your health needs are our priority.  As part of our continuing mission to provide you with exceptional heart care, we have created designated Provider Care Teams.  These Care Teams include your primary Cardiologist (physician) and Advanced Practice Providers (APPs -  Physician Assistants and Nurse Practitioners) who all work together to provide you with the care you need, when you need it.  Your next appointment:   3 months  The format for your next appointment:   In Person  Provider:   Berniece Salines, DO  Other Instructions

## 2019-10-27 NOTE — Progress Notes (Signed)
Cardiology Office Note:    Date:  10/27/2019   ID:  Russell Harris, DOB 1966-01-25, MRN [id number]  PCP:  Street, Sharon Mt, MD  Cardiologist:  Berniece Salines, DO  Electrophysiologist:  None   Referring MD: Street, Sharon Mt, *   Chief Complaint  Patient presents with  . Follow-up    History of Present Illness:    Russell Harris is a 53 y.o. male with a hx of hx of hypertension, coronary disease status post CABG x3 due to multivessel disease on September 16, 2019, Hodgkin lymphoma, COPD, former tobacco abuser.  He was seen on October 2,2020 at that time he was hypotensive but prior to his visit with me his blood pressure will have been cut down by the cardiothoracic surgeon.  Continue him on that decreased dose.  He was planning cardiac rehab which he had been scheduled for September 29, 2019.  Today he is here for follow-up, he appears to be doing well, he denies any chest pain, shortness of breath, nausea, vomiting, dizziness.  He tells me that is unable to start his current rehab given on presentation to the facility he waited 2 hours with was never called back.   Past Medical History:  Diagnosis Date  . COPD (chronic obstructive pulmonary disease) ([place])   . Hodgkin lymphoma ([place])    1997, s/p chemo  . RBBB   . Tobacco abuse     Past Surgical History:  Procedure Laterality Date  . CORONARY ARTERY BYPASS GRAFT N/A 09/16/2019   Procedure: CORONARY ARTERY BYPASS GRAFTING (CABG) x 3 WITH BILATERAL IMA'S AND RIGHT RADIAL ARTERY OPEN HARVESTING.;  Surgeon: Wonda Olds, MD;  Location: [facility];  Service: Open Heart Surgery;  Laterality: N/A;  . LEFT HEART CATH AND CORONARY ANGIOGRAPHY N/A 09/12/2019   Procedure: LEFT HEART CATH AND CORONARY ANGIOGRAPHY;  Surgeon: Troy Sine, MD;  Location: [facility];  Service: Cardiovascular;  Laterality: N/A;  . LYMPH NODE BIOPSY     of neck, diagnosed with hodgkin's lymphoma in 1997, s/p 6 month of chemo  . TEE WITHOUT CARDIOVERSION  N/A 09/16/2019   Procedure: TRANSESOPHAGEAL ECHOCARDIOGRAM (TEE);  Surgeon: Wonda Olds, MD;  Location: [facility];  Service: Open Heart Surgery;  Laterality: N/A;    Current Medications: Current Meds  Medication Sig  . acetaminophen (TYLENOL) 325 MG tablet Take 650 mg by mouth every 6 (six) hours as needed for mild pain or headache.  Marland Kitchen aspirin EC 81 MG EC tablet Take 1 tablet (81 mg total) by mouth daily.  Marland Kitchen atorvastatin (LIPITOR) 80 MG tablet Take 1 tablet (80 mg total) by mouth daily at 6 PM.  . clopidogrel (PLAVIX) 75 MG tablet Take 1 tablet (75 mg total) by mouth daily.  . Multiple Vitamin (MULTIVITAMIN WITH MINERALS) TABS tablet Take 1 tablet by mouth daily.  . [DISCONTINUED] Metoprolol Tartrate 37.5 MG TABS Take 25 mg by mouth 3 (three) times daily. (Patient taking differently: Take 25 mg by mouth 2 (two) times daily. )     Allergies:   Ketorolac and Tramadol   Social History   Socioeconomic History  . Marital status: Single    Spouse name: Not on file  . Number of children: Not on file  . Years of education: Not on file  . Highest education level: Not on file  Occupational History  . Not on file  Social Needs  . Financial resource strain: Not on file  . Food insecurity  Worry: Not on file    Inability: Not on file  . Transportation needs    Medical: Not on file    Non-medical: Not on file  Tobacco Use  . Smoking status: Former Smoker    Types: Cigarettes    Quit date: 09/10/2019    Years since quitting: 0.1  . Smokeless tobacco: Never Used  Substance and Sexual Activity  . Alcohol use: Yes    Comment: rarely  . Drug use: Yes    Types: Marijuana  . Sexual activity: Not on file  Lifestyle  . Physical activity    Days per week: Not on file    Minutes per session: Not on file  . Stress: Not on file  Relationships  . Social Herbalist on phone: Not on file    Gets together: Not on file    Attends religious service: Not on file    Active member of  club or organization: Not on file    Attends meetings of clubs or organizations: Not on file    Relationship status: Not on file  Other Topics Concern  . Not on file  Social History Narrative  . Not on file     Family History: The patient's family history includes Aneurysm in his father; Congestive Heart Failure in his mother; Heart attack in his brother and sister.  ROS:   Review of Systems  Constitution: Negative for decreased appetite, fever and weight gain.  HENT: Negative for congestion, ear discharge, hoarse voice and sore throat.   Eyes: Negative for discharge, redness, vision loss in right eye and visual halos.  Cardiovascular: Negative for chest pain, dyspnea on exertion, leg swelling, orthopnea and palpitations.  Respiratory: Negative for cough, hemoptysis, shortness of breath and snoring.   Endocrine: Negative for heat intolerance and polyphagia.  Hematologic/Lymphatic: Negative for bleeding problem. Does not bruise/bleed easily.  Skin: Negative for flushing, nail changes, rash and suspicious lesions.  Musculoskeletal: Negative for arthritis, joint pain, muscle cramps, myalgias, neck pain and stiffness.  Gastrointestinal: Negative for abdominal pain, bowel incontinence, diarrhea and excessive appetite.  Genitourinary: Negative for decreased libido, genital sores and incomplete emptying.  Neurological: Negative for brief paralysis, focal weakness, headaches and loss of balance.  Psychiatric/Behavioral: Negative for altered mental status, depression and suicidal ideas.  Allergic/Immunologic: Negative for HIV exposure and persistent infections.    EKGs/Labs/Other Studies Reviewed:    The following studies were reviewed today:   EKG:  None today.  Transesophageal echocardiogram 09/16/2019  POST-OP IMPRESSIONS Overall, there were no significant changes from pre-bypass.  PRE-OP FINDINGS Left Ventricle: The left ventricle has mildly reduced systolic function, with an  ejection fraction of 45-50% 50%. The cavity size was mildly dilated. There is no increase in left ventricular wall thickness. Left ventrical global hypokinesis without  regional wall motion abnormalities.  Right Ventricle: The right ventricle has normal systolic function. The cavity was normal. There is no increase in right ventricular wall thickness.  Left Atrium: Left atrial size was normal in size. The left atrial appendage is well visualized and there is no evidence of thrombus present.  Right Atrium: Right atrial size was normal in size. Right atrial pressure is estimated at 10 mmHg.  Interatrial Septum: No atrial level shunt detected by color flow Doppler.  Pericardium: There is no evidence of pericardial effusion.  Mitral Valve: The mitral valve is normal in structure. Mild thickening of the mitral valve leaflet. Mild calcification of the mitral valve leaflet. Mitral valve regurgitation  is trivial by color flow Doppler.  Tricuspid Valve: The tricuspid valve was normal in structure. Tricuspid valve regurgitation is trivial by color flow Doppler.  Aortic Valve: The aortic valve is normal in structure. Aortic valve regurgitation was not visualized by color flow Doppler. There is no stenosis of the aortic valve.  Pulmonic Valve: The pulmonic valve was normal in structure. Pulmonic valve regurgitation is trivial by color flow Doppler. Aorta: The aortic root and ascending aorta are normal in size and structure.  LHC    Mid RCA lesion is 20% stenosed.  Prox RCA lesion is 20% stenosed.  Ost Cx to Prox Cx lesion is 50% stenosed.  Prox Cx lesion is 50% stenosed.  1st Mrg lesion is 50% stenosed.  1st Diag lesion is 90% stenosed.  Prox LAD to Mid LAD lesion is 70% stenosed.  Mid LAD to Dist LAD lesion is 99% stenosed with 99% stenosed side branch in 2nd Diag.  Multivessel CAD with moderate coronary calcification and evidence for diffuse chronic appearing long  subtotal LAD stenosis with 90% ostial narrowing in the proximal diagonal vessel and subtotal second diagonal stenosis. There are collaterals septal perforating arteries from the day supplying the mid distal vessel as well as collateralization from the circumflex marginal branch apically.  The circumflex vessel appears to have an ulcerated proximal plaque with residual stenosis of 50% followed by 50% bifurcation stenosis of the circumflex at the takeoff of the OM vessel and ostially involving the OM vessel.  The RCA is a dominant vessel with mild luminal irregularity and mid stenoses of 20%. There is significant septal collateralization to the mid distal LAD.  Low normal global LV contractility with suggestion of possible distal anterolateral hypocontractility. EF estimate at 50 to 55%. LVEDP 9 mm.  Recent Labs: 09/15/2019: ALT 14 09/17/2019: Magnesium 1.8 09/21/2019: BUN 16; Creatinine, Ser 0.89; Hemoglobin 8.8; Platelets 222; Potassium 3.5; Sodium 136  Recent Lipid Panel    Component Value Date/Time   CHOL 191 09/12/2019 0246   TRIG 55 09/12/2019 0246   HDL 51 09/12/2019 0246   CHOLHDL 3.7 09/12/2019 0246   VLDL 11 09/12/2019 0246   LDLCALC 129 (H) 09/12/2019 0246    Physical Exam:    VS:  BP 104/60 (BP Location: Left Arm, Patient Position: Sitting, Cuff Size: Normal)   Pulse 75   Ht 6' (1.829 m)   Wt 144 lb (65.3 kg)   SpO2 97%   BMI 19.53 kg/m     Wt Readings from Last 3 Encounters:  10/27/19 144 lb (65.3 kg)  10/24/19 143 lb (64.9 kg)  09/26/19 139 lb (63 kg)     GEN: Well nourished, well developed in no acute distress HEENT: Normal NECK: No JVD; No carotid bruits LYMPHATICS: No lymphadenopathy CARDIAC: S1S2 noted,RRR, no murmurs, rubs, gallops RESPIRATORY:  Clear to auscultation without rales, wheezing or rhonchi  ABDOMEN: Soft, non-tender, non-distended, +bowel sounds, no guarding. EXTREMITIES: No edema, No cyanosis, no clubbing MUSCULOSKELETAL:  No edema; No  deformity  SKIN: Warm and dry NEUROLOGIC:  Alert and oriented x 3, non-focal PSYCHIATRIC:  Normal affect, good insight  ASSESSMENT:    1. Coronary artery disease involving native coronary artery of native heart without angina pectoris   2. Tobacco abuse   3. S/P CABG x 3   4. Benign essential HTN   5. Mixed hyperlipidemia   6. Former smoker    PLAN:    1.  Coronary artery disease-he has to be improving clinically.  He is walking  every day about 3 miles a day and denies any symptoms.  He will remain on his aspirin 81 mg daily, Lasix 75 mg daily, atorvastatin 80 mg daily.  I will decrease his Lopressor from 25 mg 3 times daily to 25 mg twice daily as his blood pressure is on the lower side.  Unfortunately patient was unable to start his cardiac rehab and tells me that he went to the facility but was never called back for his rehab has still not received a phone call for his follow-up.  We will look into this as the patient still interested in completing his cardiac rehab.  2.  Hyperlipidemia-his LDL was 129 at the start of his atorvastatin 80 mg.  He will get his blood profile done today and if needed Zetia 10 mg will be added to his regimen.  3.  He has been opting for healthier choices when he cooks now with more vegetables and he also have added more foods to his diet.  4.  He still maintain no smoking encourage patient to continue this.  The patient was counseled on continued tobacco cessation today for 5 minutes.  Counseling included reviewing the risks of smoking tobacco products, how it impacts the patient's current medical diagnoses and different strategies for quitting.  Pharmacotherapy to aid in tobacco cessation was not prescribed today. The patient coordinate with  primary care provider.  The patient was also advised to call   1-800-QUIT-NOW 858-096-9247) for additional help with quitting smoking.   The patient is in agreement with the above plan. The patient left the office in  stable condition.  The patient will follow up in 3 months   Medication Adjustments/Labs and Tests Ordered: Current medicines are reviewed at length with the patient today.  Concerns regarding medicines are outlined above.  No orders of the defined types were placed in this encounter.  Meds ordered this encounter  Medications  . metoprolol tartrate (LOPRESSOR) 25 MG tablet    Sig: Take 1 tablet (25 mg total) by mouth 2 (two) times daily.    Dispense:  180 tablet    Refill:  1    Patient Instructions  Medication Instructions:  Your physician has recommended you make the following change in your medication:  DECREASE: Metoprolol to 25 mg Take 1 tab twice daily  *If you need a refill on your cardiac medications before your next appointment, please call your pharmacy*  Lab Work: Your physician recommends that you return for lab work in: [place]   If you have labs (blood work) drawn today and your tests are completely normal, you will receive your results only by: Marland Kitchen MyChart Message (if you have MyChart) OR . A paper copy in the mail If you have any lab test that is abnormal or we need to change your treatment, we will call you to review the results.  Testing/Procedures: None  Follow-Up: At [facility], you and your health needs are our priority.  As part of our continuing mission to provide you with exceptional heart care, we have created designated Provider Care Teams.  These Care Teams include your primary Cardiologist (physician) and Advanced Practice Providers (APPs -  Physician Assistants and Nurse Practitioners) who all work together to provide you with the care you need, when you need it.  Your next appointment:   3 months  The format for your next appointment:   In Person  Provider:   Berniece Salines, DO  Other Instructions  Adopting a Healthy Lifestyle.  Know what a healthy weight is for you (roughly BMI <25) and aim to maintain this   Aim for 7+  servings of fruits and vegetables daily   65-80+ fluid ounces of water or unsweet tea for healthy kidneys   Limit to max 1 drink of alcohol per day; avoid smoking/tobacco   Limit animal fats in diet for cholesterol and heart health - choose grass fed whenever available   Avoid highly processed foods, and foods high in saturated/trans fats   Aim for low stress - take time to unwind and care for your mental health   Aim for 150 min of moderate intensity exercise weekly for heart health, and weights twice weekly for bone health   Aim for 7-9 hours of sleep daily   When it comes to diets, agreement about the perfect plan isnt easy to find, even among the experts. Experts at the [employer] developed an idea known as the Healthy Eating Plate. Just imagine a plate divided into logical, healthy portions.   The emphasis is on diet quality:   Load up on vegetables and fruits - one-half of your plate: Aim for color and variety, and remember that potatoes dont count.   Go for whole grains - one-quarter of your plate: Whole wheat, barley, wheat berries, quinoa, oats, brown rice, and foods made with them. If you want pasta, go with whole wheat pasta.   Protein power - one-quarter of your plate: Fish, chicken, beans, and nuts are all healthy, versatile protein sources. Limit red meat.   The diet, however, does go beyond the plate, offering a few other suggestions.   Use healthy plant oils, such as olive, canola, soy, corn, sunflower and peanut. Check the labels, and avoid partially hydrogenated oil, which have unhealthy trans fats.   If youre thirsty, drink water. Coffee and tea are good in moderation, but skip sugary drinks and limit milk and dairy products to one or two daily servings.   The type of carbohydrate in the diet is more important than the amount. Some sources of carbohydrates, such as vegetables, fruits, whole grains, and beans-are healthier than others.    Finally, stay active  Signed, Berniece Salines, DO  10/27/2019 10:05 AM    [place]

## 2019-10-27 NOTE — Progress Notes (Signed)
      [address]       [place], [address]             [place] OFFICE NOTE  Referring Provider is Troy Sine, MD Primary Cardiologist is Berniece Salines, DO PCP is Street, Sharon Mt, MD   HPI:  53 yo man s/p cabg with multiple arterial grafts presents for 2nd visit after hospital discharge. No complaints. Has been successful in stopping smoking.    Current Outpatient Medications  Medication Sig Dispense Refill  . acetaminophen (TYLENOL) 325 MG tablet Take 650 mg by mouth every 6 (six) hours as needed for mild pain or headache.    Marland Kitchen aspirin EC 81 MG EC tablet Take 1 tablet (81 mg total) by mouth daily.    Marland Kitchen atorvastatin (LIPITOR) 80 MG tablet Take 1 tablet (80 mg total) by mouth daily at 6 PM. 30 tablet 1  . clopidogrel (PLAVIX) 75 MG tablet Take 1 tablet (75 mg total) by mouth daily. 30 tablet 1  . Metoprolol Tartrate 37.5 MG TABS Take 25 mg by mouth 3 (three) times daily. (Patient taking differently: Take 25 mg by mouth 2 (two) times daily. ) 90 tablet 1  . Multiple Vitamin (MULTIVITAMIN WITH MINERALS) TABS tablet Take 1 tablet by mouth daily.     No current facility-administered medications for this visit.       Physical Exam:   BP 124/84   Pulse 62   Temp 98.1 F (36.7 C) (Skin)   Resp 20   Ht 6' (1.829 m)   Wt 64.9 kg   SpO2 97% Comment: RA  BMI 19.39 kg/m   General:  Well-appearing, NAD  Chest:   cta  CV:   rrr no murmur  Incisions:  C/d/i  Abdomen:  soft  Extremities:  No edema; well-healed radial artery harvest incision  Diagnostic Tests: cxr with clear lung fields   Impression:  Doing well after CABG  Plan:  F/u with primary care/cardiology  I spent in excess of 15  minutes during the conduct of this office consultation and >50% of this time involved direct face-to-face encounter with the patient for counseling and/or coordination of their care.  Level 2                 10 minutes Level 3                  15 minutes Level 4                 25 minutes Level 5                 40 minutes  B. Murvin Natal, MD 10/27/2019 8:03 AM

## 2019-10-29 ENCOUNTER — Telehealth: Payer: Self-pay | Admitting: *Deleted

## 2019-10-29 NOTE — Telephone Encounter (Signed)
-----   Message from Berniece Salines, DO sent at 10/28/2019  8:10 PM EST ----- Please let patient know that his lipid profile is good. No change in his statin therapy. Follow up as planned.

## 2019-10-29 NOTE — Telephone Encounter (Signed)
Telephone call to patient. Left message that lipid panel was normal and to call with any questions.

## 2019-11-15 ENCOUNTER — Other Ambulatory Visit: Payer: Self-pay | Admitting: Surgical

## 2019-12-20 DIAGNOSIS — R0781 Pleurodynia: Secondary | ICD-10-CM | POA: Diagnosis not present

## 2019-12-20 DIAGNOSIS — B029 Zoster without complications: Secondary | ICD-10-CM | POA: Diagnosis not present

## 2019-12-20 DIAGNOSIS — R0789 Other chest pain: Secondary | ICD-10-CM | POA: Diagnosis not present

## 2020-01-14 DIAGNOSIS — B029 Zoster without complications: Secondary | ICD-10-CM | POA: Diagnosis not present

## 2020-01-14 DIAGNOSIS — Z87891 Personal history of nicotine dependence: Secondary | ICD-10-CM | POA: Diagnosis not present

## 2020-01-14 DIAGNOSIS — I2510 Atherosclerotic heart disease of native coronary artery without angina pectoris: Secondary | ICD-10-CM | POA: Diagnosis not present

## 2020-01-14 DIAGNOSIS — I252 Old myocardial infarction: Secondary | ICD-10-CM | POA: Diagnosis not present

## 2020-01-14 DIAGNOSIS — R636 Underweight: Secondary | ICD-10-CM | POA: Diagnosis not present

## 2020-01-14 DIAGNOSIS — Z681 Body mass index (BMI) 19 or less, adult: Secondary | ICD-10-CM | POA: Diagnosis not present

## 2020-01-14 DIAGNOSIS — Z951 Presence of aortocoronary bypass graft: Secondary | ICD-10-CM | POA: Diagnosis not present

## 2020-02-02 ENCOUNTER — Encounter: Payer: Self-pay | Admitting: Cardiology

## 2020-02-02 ENCOUNTER — Ambulatory Visit (INDEPENDENT_AMBULATORY_CARE_PROVIDER_SITE_OTHER): Payer: Medicare HMO | Admitting: Cardiology

## 2020-02-02 ENCOUNTER — Other Ambulatory Visit: Payer: Self-pay

## 2020-02-02 VITALS — BP 90/64 | HR 65 | Ht 72.0 in | Wt 154.0 lb

## 2020-02-02 DIAGNOSIS — I10 Essential (primary) hypertension: Secondary | ICD-10-CM | POA: Diagnosis not present

## 2020-02-02 DIAGNOSIS — Z720 Tobacco use: Secondary | ICD-10-CM | POA: Diagnosis not present

## 2020-02-02 DIAGNOSIS — I2510 Atherosclerotic heart disease of native coronary artery without angina pectoris: Secondary | ICD-10-CM

## 2020-02-02 DIAGNOSIS — J449 Chronic obstructive pulmonary disease, unspecified: Secondary | ICD-10-CM | POA: Diagnosis not present

## 2020-02-02 NOTE — Progress Notes (Signed)
Cardiology Office Note:    Date:  02/02/2020   ID:  IZZAK Harris, DOB 1966/07/03, MRN [id number]  PCP:  Street, Sharon Mt, MD  Cardiologist:  Berniece Salines, DO  Electrophysiologist:  None   Referring MD: Street, Sharon Mt, *   Chief Complaint  Patient presents with  . Follow-up    History of Present Illness:    Russell Harris is a 54 y.o. male with a hx of hx of hypertension, coronary disease status post CABG x3 due to multivessel disease on September 16, 2019, Hodgkin lymphoma, COPD, former tobacco abuser.  At his last visit he tells me that he suffered shingle infection but has since recovered.  He he has been doing well from a cardiovascular standpoint he tells me that he is walking daily and has not had any worsening symptoms.   Past Medical History:  Diagnosis Date  . COPD (chronic obstructive pulmonary disease) ([place])   . Hodgkin lymphoma ([place])    1997, s/p chemo  . RBBB   . Tobacco abuse     Past Surgical History:  Procedure Laterality Date  . CORONARY ARTERY BYPASS GRAFT N/A 09/16/2019   Procedure: CORONARY ARTERY BYPASS GRAFTING (CABG) x 3 WITH BILATERAL IMA'S AND RIGHT RADIAL ARTERY OPEN HARVESTING.;  Surgeon: Wonda Olds, MD;  Location: [facility];  Service: Open Heart Surgery;  Laterality: N/A;  . LEFT HEART CATH AND CORONARY ANGIOGRAPHY N/A 09/12/2019   Procedure: LEFT HEART CATH AND CORONARY ANGIOGRAPHY;  Surgeon: Troy Sine, MD;  Location: [facility];  Service: Cardiovascular;  Laterality: N/A;  . LYMPH NODE BIOPSY     of neck, diagnosed with hodgkin's lymphoma in 1997, s/p 6 month of chemo  . TEE WITHOUT CARDIOVERSION N/A 09/16/2019   Procedure: TRANSESOPHAGEAL ECHOCARDIOGRAM (TEE);  Surgeon: Wonda Olds, MD;  Location: [facility] [place];  Service: Open Heart Surgery;  Laterality: N/A;    Current Medications: Current Meds  Medication Sig  . acetaminophen (TYLENOL) 325 MG tablet Take 650 mg by mouth every 6 (six) hours as needed for mild pain or  headache.  Marland Kitchen aspirin EC 81 MG EC tablet Take 1 tablet (81 mg total) by mouth daily.  Marland Kitchen atorvastatin (LIPITOR) 80 MG tablet Take 1 tablet (80 mg total) by mouth daily at 6 PM.  . gabapentin (NEURONTIN) 100 MG capsule 100 mg 3 (three) times daily.  . metoprolol tartrate (LOPRESSOR) 25 MG tablet Take 1 tablet (25 mg total) by mouth 2 (two) times daily.  . Multiple Vitamin (MULTIVITAMIN WITH MINERALS) TABS tablet Take 1 tablet by mouth daily.  . predniSONE (STERAPRED UNI-PAK 48 TAB) 10 MG (48) TBPK tablet      Allergies:   Ketorolac and Tramadol   Social History   Socioeconomic History  . Marital status: Single    Spouse name: Not on file  . Number of children: Not on file  . Years of education: Not on file  . Highest education level: Not on file  Occupational History  . Not on file  Tobacco Use  . Smoking status: Former Smoker    Types: Cigarettes    Quit date: 09/10/2019    Years since quitting: 0.3  . Smokeless tobacco: Never Used  Substance and Sexual Activity  . Alcohol use: Yes    Comment: rarely  . Drug use: Yes    Types: Marijuana  . Sexual activity: Not on file  Other Topics Concern  . Not on file  Social History Narrative  .  Not on file   Social Determinants of Health   Financial Resource Strain:   . Difficulty of Paying Living Expenses: Not on file  Food Insecurity:   . Worried About Charity fundraiser in the Last Year: Not on file  . Ran Out of Food in the Last Year: Not on file  Transportation Needs:   . Lack of Transportation (Medical): Not on file  . Lack of Transportation (Non-Medical): Not on file  Physical Activity:   . Days of Exercise per Week: Not on file  . Minutes of Exercise per Session: Not on file  Stress:   . Feeling of Stress : Not on file  Social Connections:   . Frequency of Communication with Friends and Family: Not on file  . Frequency of Social Gatherings with Friends and Family: Not on file  . Attends Religious Services: Not on  file  . Active Member of Clubs or Organizations: Not on file  . Attends Archivist Meetings: Not on file  . Marital Status: Not on file     Family History: The patient's family history includes Aneurysm in his father; Congestive Heart Failure in his mother; Heart attack in his brother and sister.  ROS:   Review of Systems  Constitution: Negative for decreased appetite, fever and weight gain.  HENT: Negative for congestion, ear discharge, hoarse voice and sore throat.   Eyes: Negative for discharge, redness, vision loss in right eye and visual halos.  Cardiovascular: Negative for chest pain, dyspnea on exertion, leg swelling, orthopnea and palpitations.  Respiratory: Negative for cough, hemoptysis, shortness of breath and snoring.   Endocrine: Negative for heat intolerance and polyphagia.  Hematologic/Lymphatic: Negative for bleeding problem. Does not bruise/bleed easily.  Skin: Negative for flushing, nail changes, rash and suspicious lesions.  Musculoskeletal: Negative for arthritis, joint pain, muscle cramps, myalgias, neck pain and stiffness.  Gastrointestinal: Negative for abdominal pain, bowel incontinence, diarrhea and excessive appetite.  Genitourinary: Negative for decreased libido, genital sores and incomplete emptying.  Neurological: Negative for brief paralysis, focal weakness, headaches and loss of balance.  Psychiatric/Behavioral: Negative for altered mental status, depression and suicidal ideas.  Allergic/Immunologic: Negative for HIV exposure and persistent infections.    EKGs/Labs/Other Studies Reviewed:    The following studies were reviewed today:   EKG: None today   Transthoracic Echocardiogram 1. Left ventricular ejection fraction, by visual estimation, is 50 to  55%. The left ventricle has mildly decreased function. Normal left  ventricular size. There is no left ventricular hypertrophy. False tendon  in the left ventricle.  2. Basal and mid  inferolateral wall is abnormal.  3. Elevated left ventricular end-diastolic pressure.  4. Left ventricular diastolic Doppler parameters are consistent with  impaired relaxation pattern of LV diastolic filling.  5. Global right ventricle has mildly reduced systolic function.The right  ventricular size is mildly enlarged. No increase in right ventricular wall  thickness.  6. Left atrial size was normal.  7. Right atrial size was normal.  8. The mitral valve is normal in structure. No evidence of mitral valve  regurgitation. No evidence of mitral stenosis.  9. The tricuspid valve is normal in structure. Tricuspid valve  regurgitation was not visualized by color flow Doppler.  10. The aortic valve is normal in structure. Aortic valve regurgitation  was not visualized by color flow Doppler. Structurally normal aortic  valve, with no evidence of sclerosis or stenosis.  11. The pulmonic valve was normal in structure. Pulmonic valve  regurgitation  is not visualized by color flow Doppler.  12. The inferior vena cava is normal in size with greater than 50%  respiratory variability, suggesting right atrial pressure of 3 mmHg.   Recent Labs: 09/15/2019: ALT 14 09/17/2019: Magnesium 1.8 09/21/2019: BUN 16; Creatinine, Ser 0.89; Hemoglobin 8.8; Platelets 222; Potassium 3.5; Sodium 136  Recent Lipid Panel    Component Value Date/Time   CHOL 137 10/27/2019 0935   TRIG 64 10/27/2019 0935   HDL 55 10/27/2019 0935   CHOLHDL 2.5 10/27/2019 0935   CHOLHDL 3.7 09/12/2019 0246   VLDL 11 09/12/2019 0246   LDLCALC 69 10/27/2019 0935    Physical Exam:    VS:  BP 90/64 (BP Location: Right Arm, Patient Position: Sitting, Cuff Size: Normal)   Pulse 65   Ht 6' (1.829 m)   Wt 154 lb (69.9 kg)   SpO2 98%   BMI 20.89 kg/m     Wt Readings from Last 3 Encounters:  02/02/20 154 lb (69.9 kg)  10/27/19 144 lb (65.3 kg)  10/24/19 143 lb (64.9 kg)     GEN: Well nourished, well developed in no acute  distress HEENT: Normal NECK: No JVD; No carotid bruits LYMPHATICS: No lymphadenopathy CARDIAC: S1S2 noted,RRR, no murmurs, rubs, gallops RESPIRATORY:  Clear to auscultation without rales, wheezing or rhonchi  ABDOMEN: Soft, non-tender, non-distended, +bowel sounds, no guarding. EXTREMITIES: No edema, No cyanosis, no clubbing MUSCULOSKELETAL:  No deformity  SKIN: Warm and dry NEUROLOGIC:  Alert and oriented x 3, non-focal PSYCHIATRIC:  Normal affect, good insight  ASSESSMENT:    1. Benign essential HTN   2. Coronary artery disease involving native coronary artery of native heart without angina pectoris   3. Tobacco abuse    PLAN:     1.  He is hypotensive in the office today.  Going to decrease his Lopressor from 25 mg twice a day to 12.5 mg twice daily.  Have educated patient about this he understands using teach back method.  Continue him on his aspirin 81 mg daily as well as atorvastatin 80 mg daily.  2.  He was encouraged to continue his exercises as he tells me he walks at least 30 minutes a day.  3.  The patient was counseled on tobacco cessation today for 5 minutes.  Counseling included reviewing the risks of smoking tobacco products, how it impacts the patient's current medical diagnoses and different strategies for quitting.  Pharmacotherapy to aid in tobacco cessation was not prescribed today. The patient coordinate with  primary care provider.  The patient was also advised to call   1-800-QUIT-NOW 518-031-2064) for additional help with quitting smoking.   The patient is in agreement with the above plan. The patient left the office in stable condition.  The patient will follow up in 1 month due to medication change.   Medication Adjustments/Labs and Tests Ordered: Current medicines are reviewed at length with the patient today.  Concerns regarding medicines are outlined above.  No orders of the defined types were placed in this encounter.  No orders of the defined  types were placed in this encounter.   Patient Instructions  Medication Instructions:  Your physician has recommended you make the following change in your medication: DECREASE LOPRESSOR TO 12.5 TWO TIMES DAILY  *If you need a refill on your cardiac medications before your next appointment, please call your pharmacy*  Lab Work: NONE If you have labs (blood work) drawn today and your tests are completely normal, you will receive your  results only by: Marland Kitchen MyChart Message (if you have MyChart) OR . A paper copy in the mail If you have any lab test that is abnormal or we need to change your treatment, we will call you to review the results.  Testing/Procedures: NONE  Follow-Up: At [facility], you and your health needs are our priority.  As part of our continuing mission to provide you with exceptional heart care, we have created designated Provider Care Teams.  These Care Teams include your primary Cardiologist (physician) and Advanced Practice Providers (APPs -  Physician Assistants and Nurse Practitioners) who all work together to provide you with the care you need, when you need it.  Your next appointment:   1 month(s)  The format for your next appointment:   In Person  Provider:   Berniece Salines, DO  Other Instructions      Adopting a Healthy Lifestyle.  Know what a healthy weight is for you (roughly BMI <25) and aim to maintain this   Aim for 7+ servings of fruits and vegetables daily   65-80+ fluid ounces of water or unsweet tea for healthy kidneys   Limit to max 1 drink of alcohol per day; avoid smoking/tobacco   Limit animal fats in diet for cholesterol and heart health - choose grass fed whenever available   Avoid highly processed foods, and foods high in saturated/trans fats   Aim for low stress - take time to unwind and care for your mental health   Aim for 150 min of moderate intensity exercise weekly for heart health, and weights twice weekly for bone  health   Aim for 7-9 hours of sleep daily   When it comes to diets, agreement about the perfect plan isnt easy to find, even among the experts. Experts at the [employer] [place] developed an idea known as the Healthy Eating Plate. Just imagine a plate divided into logical, healthy portions.   The emphasis is on diet quality:   Load up on vegetables and fruits - one-half of your plate: Aim for color and variety, and remember that potatoes dont count.   Go for whole grains - one-quarter of your plate: Whole wheat, barley, wheat berries, quinoa, oats, brown rice, and foods made with them. If you want pasta, go with whole wheat pasta.   Protein power - one-quarter of your plate: Fish, chicken, beans, and nuts are all healthy, versatile protein sources. Limit red meat.   The diet, however, does go beyond the plate, offering a few other suggestions.   Use healthy plant oils, such as olive, canola, soy, corn, sunflower and peanut. Check the labels, and avoid partially hydrogenated oil, which have unhealthy trans fats.   If youre thirsty, drink water. Coffee and tea are good in moderation, but skip sugary drinks and limit milk and dairy products to one or two daily servings.   The type of carbohydrate in the diet is more important than the amount. Some sources of carbohydrates, such as vegetables, fruits, whole grains, and beans-are healthier than others.   Finally, stay active  Signed, Berniece Salines, DO  02/02/2020 11:22 AM    [place]

## 2020-02-02 NOTE — Patient Instructions (Signed)
Medication Instructions:  Your physician has recommended you make the following change in your medication: DECREASE LOPRESSOR TO 12.5 TWO TIMES DAILY  *If you need a refill on your cardiac medications before your next appointment, please call your pharmacy*  Lab Work: NONE If you have labs (blood work) drawn today and your tests are completely normal, you will receive your results only by: Marland Kitchen MyChart Message (if you have MyChart) OR . A paper copy in the mail If you have any lab test that is abnormal or we need to change your treatment, we will call you to review the results.  Testing/Procedures: NONE  Follow-Up: At [facility], you and your health needs are our priority.  As part of our continuing mission to provide you with exceptional heart care, we have created designated Provider Care Teams.  These Care Teams include your primary Cardiologist (physician) and Advanced Practice Providers (APPs -  Physician Assistants and Nurse Practitioners) who all work together to provide you with the care you need, when you need it.  Your next appointment:   1 month(s)  The format for your next appointment:   In Person  Provider:   Berniece Salines, DO  Other Instructions

## 2020-03-03 ENCOUNTER — Encounter (INDEPENDENT_AMBULATORY_CARE_PROVIDER_SITE_OTHER): Payer: Self-pay

## 2020-03-03 ENCOUNTER — Other Ambulatory Visit: Payer: Self-pay

## 2020-03-03 ENCOUNTER — Encounter: Payer: Self-pay | Admitting: Cardiology

## 2020-03-03 ENCOUNTER — Ambulatory Visit (INDEPENDENT_AMBULATORY_CARE_PROVIDER_SITE_OTHER): Payer: Medicare HMO | Admitting: Cardiology

## 2020-03-03 VITALS — BP 100/60 | HR 57 | Ht 72.0 in | Wt 156.0 lb

## 2020-03-03 DIAGNOSIS — Z951 Presence of aortocoronary bypass graft: Secondary | ICD-10-CM

## 2020-03-03 DIAGNOSIS — I10 Essential (primary) hypertension: Secondary | ICD-10-CM

## 2020-03-03 DIAGNOSIS — Z87891 Personal history of nicotine dependence: Secondary | ICD-10-CM

## 2020-03-03 DIAGNOSIS — I2510 Atherosclerotic heart disease of native coronary artery without angina pectoris: Secondary | ICD-10-CM

## 2020-03-03 NOTE — Patient Instructions (Addendum)

## 2020-03-03 NOTE — Progress Notes (Signed)
Cardiology Office Note:    Date:  03/03/2020   ID:  Russell Harris, DOB 04-02-1966, MRN [id number]  PCP:  Street, Sharon Mt, MD  Cardiologist:  Berniece Salines, DO  Electrophysiologist:  None   Referring MD: Street, Sharon Mt, *   Follow up CAD and hypertension   History of Present Illness:    Russell Harris a 54 y.o.malewith a hx of hx of hypertension, coronary disease status post CABG x3 due to multivessel disease on September 16, 2019, Hodgkin lymphoma, COPD, former tobacco abuser.  During his last visit he was hypotensive, therefore lopressor was decreased to 12.5 mg twice daily.   Today he tells me that he has been doing well. He denies any chest pain, shortness of breath. He continues to exercise for at least 30 minutes daily.   Past Medical History:  Diagnosis Date  . COPD (chronic obstructive pulmonary disease) ([place])   . Hodgkin lymphoma ([place])    1997, s/p chemo  . RBBB   . Tobacco abuse     Past Surgical History:  Procedure Laterality Date  . CORONARY ARTERY BYPASS GRAFT N/A 09/16/2019   Procedure: CORONARY ARTERY BYPASS GRAFTING (CABG) x 3 WITH BILATERAL IMA'S AND RIGHT RADIAL ARTERY OPEN HARVESTING.;  Surgeon: Wonda Olds, MD;  Location: [facility];  Service: Open Heart Surgery;  Laterality: N/A;  . LEFT HEART CATH AND CORONARY ANGIOGRAPHY N/A 09/12/2019   Procedure: LEFT HEART CATH AND CORONARY ANGIOGRAPHY;  Surgeon: Troy Sine, MD;  Location: [facility];  Service: Cardiovascular;  Laterality: N/A;  . LYMPH NODE BIOPSY     of neck, diagnosed with hodgkin's lymphoma in 1997, s/p 6 month of chemo  . TEE WITHOUT CARDIOVERSION N/A 09/16/2019   Procedure: TRANSESOPHAGEAL ECHOCARDIOGRAM (TEE);  Surgeon: Wonda Olds, MD;  Location: [facility];  Service: Open Heart Surgery;  Laterality: N/A;    Current Medications: Current Meds  Medication Sig  . acetaminophen (TYLENOL) 325 MG tablet Take 650 mg by mouth every 6 (six) hours as needed for mild pain or  headache.  Marland Kitchen aspirin EC 81 MG EC tablet Take 1 tablet (81 mg total) by mouth daily.  Marland Kitchen atorvastatin (LIPITOR) 80 MG tablet Take 1 tablet (80 mg total) by mouth daily at 6 PM.  . gabapentin (NEURONTIN) 100 MG capsule 100 mg 3 (three) times daily.  . Multiple Vitamin (MULTIVITAMIN WITH MINERALS) TABS tablet Take 1 tablet by mouth daily.     Allergies:   Ketorolac and Tramadol   Social History   Socioeconomic History  . Marital status: Single    Spouse name: Not on file  . Number of children: Not on file  . Years of education: Not on file  . Highest education level: Not on file  Occupational History  . Not on file  Tobacco Use  . Smoking status: Former Smoker    Types: Cigarettes    Quit date: 09/10/2019    Years since quitting: 0.4  . Smokeless tobacco: Never Used  Substance and Sexual Activity  . Alcohol use: Yes    Comment: rarely  . Drug use: Yes    Types: Marijuana  . Sexual activity: Not on file  Other Topics Concern  . Not on file  Social History Narrative  . Not on file   Social Determinants of Health   Financial Resource Strain:   . Difficulty of Paying Living Expenses: Not on file  Food Insecurity:   . Worried About Charity fundraiser  in the Last Year: Not on file  . Ran Out of Food in the Last Year: Not on file  Transportation Needs:   . Lack of Transportation (Medical): Not on file  . Lack of Transportation (Non-Medical): Not on file  Physical Activity:   . Days of Exercise per Week: Not on file  . Minutes of Exercise per Session: Not on file  Stress:   . Feeling of Stress : Not on file  Social Connections:   . Frequency of Communication with Friends and Family: Not on file  . Frequency of Social Gatherings with Friends and Family: Not on file  . Attends Religious Services: Not on file  . Active Member of Clubs or Organizations: Not on file  . Attends Archivist Meetings: Not on file  . Marital Status: Not on file     Family History: The  patient's family history includes Aneurysm in his father; Congestive Heart Failure in his mother; Heart attack in his brother and sister.  ROS:   Review of Systems  Constitution: Negative for decreased appetite, fever and weight gain.  HENT: Negative for congestion, ear discharge, hoarse voice and sore throat.   Eyes: Negative for discharge, redness, vision loss in right eye and visual halos.  Cardiovascular: Negative for chest pain, dyspnea on exertion, leg swelling, orthopnea and palpitations.  Respiratory: Negative for cough, hemoptysis, shortness of breath and snoring.   Endocrine: Negative for heat intolerance and polyphagia.  Hematologic/Lymphatic: Negative for bleeding problem. Does not bruise/bleed easily.  Skin: Negative for flushing, nail changes, rash and suspicious lesions.  Musculoskeletal: Negative for arthritis, joint pain, muscle cramps, myalgias, neck pain and stiffness.  Gastrointestinal: Negative for abdominal pain, bowel incontinence, diarrhea and excessive appetite.  Genitourinary: Negative for decreased libido, genital sores and incomplete emptying.  Neurological: Negative for brief paralysis, focal weakness, headaches and loss of balance.  Psychiatric/Behavioral: Negative for altered mental status, depression and suicidal ideas.  Allergic/Immunologic: Negative for HIV exposure and persistent infections.    EKGs/Labs/Other Studies Reviewed:    The following studies were reviewed today:   EKG:  None today  Recent Labs: 09/15/2019: ALT 14 09/17/2019: Magnesium 1.8 09/21/2019: BUN 16; Creatinine, Ser 0.89; Hemoglobin 8.8; Platelets 222; Potassium 3.5; Sodium 136  Recent Lipid Panel    Component Value Date/Time   CHOL 137 10/27/2019 0935   TRIG 64 10/27/2019 0935   HDL 55 10/27/2019 0935   CHOLHDL 2.5 10/27/2019 0935   CHOLHDL 3.7 09/12/2019 0246   VLDL 11 09/12/2019 0246   LDLCALC 69 10/27/2019 0935    Physical Exam:    VS:  BP 100/60 (BP Location: Left  Arm, Patient Position: Sitting)   Pulse (!) 57   Ht 6' (1.829 m)   Wt 156 lb (70.8 kg)   SpO2 96%   BMI 21.16 kg/m     Wt Readings from Last 3 Encounters:  03/03/20 156 lb (70.8 kg)  02/02/20 154 lb (69.9 kg)  10/27/19 144 lb (65.3 kg)     GEN: Well nourished, well developed in no acute distress HEENT: Normal NECK: No JVD; No carotid bruits LYMPHATICS: No lymphadenopathy CARDIAC: S1S2 noted,RRR, no murmurs, rubs, gallops RESPIRATORY:  Clear to auscultation without rales, wheezing or rhonchi  ABDOMEN: Soft, non-tender, non-distended, +bowel sounds, no guarding. EXTREMITIES: No edema, No cyanosis, no clubbing MUSCULOSKELETAL:  No deformity  SKIN: Warm and dry NEUROLOGIC:  Alert and oriented x 3, non-focal PSYCHIATRIC:  Normal affect, good insight  ASSESSMENT:    1. Coronary artery  disease involving native coronary artery of native heart without angina pectoris   2. Benign essential HTN   3. History of tobacco use   4. S/P CABG x 3    PLAN:     He is doing well from a cardiovascular standpoint. His blood pressure has improved since his last visit. He is happy with his progress. He continues to exercise for about 30 mins daily. I did congratulate the patient today because he continuing to stay off cigarette and tobacco use.   Continue his current medication regimen which includes Aspirin 81 mg daily, Lipitor 80mg  daily and lopressor 12.5 mg bid.   The patient is in agreement with the above plan. The patient left the office in stable condition.  The patient will follow up in 6 months or sooner if needed.   Medication Adjustments/Labs and Tests Ordered: Current medicines are reviewed at length with the patient today.  Concerns regarding medicines are outlined above.  No orders of the defined types were placed in this encounter.  No orders of the defined types were placed in this encounter.   Patient Instructions  Medication Instructions:  Your physician recommends that  you continue on your current medications as directed. Please refer to the Current Medication list given to you today.  *If you need a refill on your cardiac medications before your next appointment, please call your pharmacy*   Lab Work: None.  If you have labs (blood work) drawn today and your tests are completely normal, you will receive your results only by: Marland Kitchen MyChart Message (if you have MyChart) OR . A paper copy in the mail If you have any lab test that is abnormal or we need to change your treatment, we will call you to review the results.   Testing/Procedures: None.   Follow-Up: At [facility], you and your health needs are our priority.  As part of our continuing mission to provide you with exceptional heart care, we have created designated Provider Care Teams.  These Care Teams include your primary Cardiologist (physician) and Advanced Practice Providers (APPs -  Physician Assistants and Nurse Practitioners) who all work together to provide you with the care you need, when you need it.  We recommend signing up for the patient portal called "MyChart".  Sign up information is provided on this After Visit Summary.  MyChart is used to connect with patients for Virtual Visits (Telemedicine).  Patients are able to view lab/test results, encounter notes, upcoming appointments, etc.  Non-urgent messages can be sent to your provider as well.   To learn more about what you can do with MyChart, go to [email or web address].    Your next appointment:   6 month(s)  The format for your next appointment:   In Person  Provider:   Berniece Salines, DO   Other Instructions       Adopting a Healthy Lifestyle.  Know what a healthy weight is for you (roughly BMI <25) and aim to maintain this   Aim for 7+ servings of fruits and vegetables daily   65-80+ fluid ounces of water or unsweet tea for healthy kidneys   Limit to max 1 drink of alcohol per day; avoid smoking/tobacco     Limit animal fats in diet for cholesterol and heart health - choose grass fed whenever available   Avoid highly processed foods, and foods high in saturated/trans fats   Aim for low stress - take time to unwind and care for your mental health  Aim for 150 min of moderate intensity exercise weekly for heart health, and weights twice weekly for bone health   Aim for 7-9 hours of sleep daily   When it comes to diets, agreement about the perfect plan isnt easy to find, even among the experts. Experts at the [employer] developed an idea known as the Healthy Eating Plate. Just imagine a plate divided into logical, healthy portions.   The emphasis is on diet quality:   Load up on vegetables and fruits - one-half of your plate: Aim for color and variety, and remember that potatoes dont count.   Go for whole grains - one-quarter of your plate: Whole wheat, barley, wheat berries, quinoa, oats, brown rice, and foods made with them. If you want pasta, go with whole wheat pasta.   Protein power - one-quarter of your plate: Fish, chicken, beans, and nuts are all healthy, versatile protein sources. Limit red meat.   The diet, however, does go beyond the plate, offering a few other suggestions.   Use healthy plant oils, such as olive, canola, soy, corn, sunflower and peanut. Check the labels, and avoid partially hydrogenated oil, which have unhealthy trans fats.   If youre thirsty, drink water. Coffee and tea are good in moderation, but skip sugary drinks and limit milk and dairy products to one or two daily servings.   The type of carbohydrate in the diet is more important than the amount. Some sources of carbohydrates, such as vegetables, fruits, whole grains, and beans-are healthier than others.   Finally, stay active  Signed, Berniece Salines, DO  03/03/2020 8:49 PM    [facility]

## 2020-03-04 ENCOUNTER — Other Ambulatory Visit: Payer: Self-pay | Admitting: Surgical

## 2020-03-12 ENCOUNTER — Other Ambulatory Visit: Payer: Self-pay | Admitting: Surgical

## 2020-03-26 ENCOUNTER — Telehealth: Payer: Self-pay | Admitting: Cardiology

## 2020-03-26 MED ORDER — METOPROLOL TARTRATE 25 MG PO TABS: 12.5000 mg | ORAL_TABLET | Freq: Two times a day (BID) | ORAL | 3 refills | Status: DC

## 2020-03-26 MED ORDER — ATORVASTATIN CALCIUM 80 MG PO TABS: 80.0000 mg | ORAL_TABLET | Freq: Every day | ORAL | 3 refills | Status: DC

## 2020-03-26 NOTE — Telephone Encounter (Signed)
Spoke with patient who needs refills for atorvastatin 80 mg and metoprolol 12.5 mg BID. Will send in prescriptions.

## 2020-03-26 NOTE — Telephone Encounter (Signed)
New message   Patient needs a new prescription for atorvastatin (LIPITOR) 80 MG tablet and atorvastatin (LIPITOR) 80 MG tablet send to [facility], [place]

## 2020-09-10 DIAGNOSIS — I4510 Unspecified right bundle-branch block: Secondary | ICD-10-CM | POA: Insufficient documentation

## 2020-09-10 DIAGNOSIS — C8190 Hodgkin lymphoma, unspecified, unspecified site: Secondary | ICD-10-CM | POA: Insufficient documentation

## 2020-09-13 ENCOUNTER — Ambulatory Visit: Payer: Medicare HMO | Admitting: Cardiology

## 2020-09-13 ENCOUNTER — Ambulatory Visit (INDEPENDENT_AMBULATORY_CARE_PROVIDER_SITE_OTHER): Payer: Medicare HMO | Admitting: Cardiology

## 2020-09-13 ENCOUNTER — Other Ambulatory Visit: Payer: Self-pay

## 2020-09-13 ENCOUNTER — Encounter: Payer: Self-pay | Admitting: Cardiology

## 2020-09-13 VITALS — BP 96/60 | HR 70 | Ht 72.0 in | Wt 161.0 lb

## 2020-09-13 DIAGNOSIS — Z951 Presence of aortocoronary bypass graft: Secondary | ICD-10-CM

## 2020-09-13 DIAGNOSIS — I10 Essential (primary) hypertension: Secondary | ICD-10-CM

## 2020-09-13 DIAGNOSIS — I2510 Atherosclerotic heart disease of native coronary artery without angina pectoris: Secondary | ICD-10-CM

## 2020-09-13 NOTE — Progress Notes (Signed)
Cardiology Office Note:    Date:  09/13/2020   ID:  Russell Harris, DOB 1966/03/17, MRN [id number]  PCP:  Street, Sharon Mt, MD  Cardiologist:  Berniece Salines, DO  Electrophysiologist:  None   Referring MD: Street, Sharon Mt, *   Chief Complaint  Patient presents with  . Follow-up   History of Present Illness:    Russell Harris is a 54 y.o. male with a hx of hypertension, coronary disease status post CABG x3 due to multivessel disease on September 16, 2019, Hodgkin lymphoma, COPD, former tobacco abuser.  The patient is here today for follow-up visit.  He tells me that he things are going well a cardiovascular standpoint.  But he has had some trouble with sexual dysfunction.  Past Medical History:  Diagnosis Date  . Abnormal stress test 09/12/2019  . Benign essential HTN 09/12/2019  . Chest pain 09/11/2019  . COPD (chronic obstructive pulmonary disease) ([place])   . Coronary artery disease involving native heart without angina pectoris   . Hodgkin lymphoma ([place])    1997, s/p chemo  . Protein-calorie malnutrition, severe 09/18/2019  . RBBB   . S/P CABG x 3 09/16/2019  . Tobacco abuse     Past Surgical History:  Procedure Laterality Date  . CORONARY ARTERY BYPASS GRAFT N/A 09/16/2019   Procedure: CORONARY ARTERY BYPASS GRAFTING (CABG) x 3 WITH BILATERAL IMA'S AND RIGHT RADIAL ARTERY OPEN HARVESTING.;  Surgeon: Wonda Olds, MD;  Location: [facility];  Service: Open Heart Surgery;  Laterality: N/A;  . LEFT HEART CATH AND CORONARY ANGIOGRAPHY N/A 09/12/2019   Procedure: LEFT HEART CATH AND CORONARY ANGIOGRAPHY;  Surgeon: Troy Sine, MD;  Location: [facility];  Service: Cardiovascular;  Laterality: N/A;  . LYMPH NODE BIOPSY     of neck, diagnosed with hodgkin's lymphoma in 1997, s/p 6 month of chemo  . TEE WITHOUT CARDIOVERSION N/A 09/16/2019   Procedure: TRANSESOPHAGEAL ECHOCARDIOGRAM (TEE);  Surgeon: Wonda Olds, MD;  Location: [facility];  Service: Open Heart Surgery;   Laterality: N/A;    Current Medications: Current Meds  Medication Sig  . acetaminophen (TYLENOL) 325 MG tablet Take 650 mg by mouth every 6 (six) hours as needed for mild pain or headache.  Marland Kitchen aspirin EC 81 MG EC tablet Take 1 tablet (81 mg total) by mouth daily.  Marland Kitchen atorvastatin (LIPITOR) 80 MG tablet Take 1 tablet (80 mg total) by mouth daily at 6 PM.  . Multiple Vitamin (MULTIVITAMIN WITH MINERALS) TABS tablet Take 1 tablet by mouth daily.  . [DISCONTINUED] metoprolol tartrate (LOPRESSOR) 25 MG tablet Take 0.5 tablets (12.5 mg total) by mouth 2 (two) times daily.     Allergies:   Ketorolac and Tramadol   Social History   Socioeconomic History  . Marital status: Single    Spouse name: Not on file  . Number of children: Not on file  . Years of education: Not on file  . Highest education level: Not on file  Occupational History  . Not on file  Tobacco Use  . Smoking status: Former Smoker    Types: Cigarettes    Quit date: 09/10/2019    Years since quitting: 1.0  . Smokeless tobacco: Never Used  Vaping Use  . Vaping Use: Never used  Substance and Sexual Activity  . Alcohol use: Yes    Comment: rarely  . Drug use: Yes    Types: Marijuana  . Sexual activity: Not on file  Other Topics Concern  .  Not on file  Social History Narrative  . Not on file   Social Determinants of Health   Financial Resource Strain:   . Difficulty of Paying Living Expenses: Not on file  Food Insecurity:   . Worried About Charity fundraiser in the Last Year: Not on file  . Ran Out of Food in the Last Year: Not on file  Transportation Needs:   . Lack of Transportation (Medical): Not on file  . Lack of Transportation (Non-Medical): Not on file  Physical Activity:   . Days of Exercise per Week: Not on file  . Minutes of Exercise per Session: Not on file  Stress:   . Feeling of Stress : Not on file  Social Connections:   . Frequency of Communication with Friends and Family: Not on file  .  Frequency of Social Gatherings with Friends and Family: Not on file  . Attends Religious Services: Not on file  . Active Member of Clubs or Organizations: Not on file  . Attends Archivist Meetings: Not on file  . Marital Status: Not on file     Family History: The patient's family history includes Aneurysm in his father; Congestive Heart Failure in his mother; Heart attack in his brother and sister.  ROS:   Review of Systems  Constitution: Negative for decreased appetite, fever and weight gain.  HENT: Negative for congestion, ear discharge, hoarse voice and sore throat.   Eyes: Negative for discharge, redness, vision loss in right eye and visual halos.  Cardiovascular: Negative for chest pain, dyspnea on exertion, leg swelling, orthopnea and palpitations.  Respiratory: Negative for cough, hemoptysis, shortness of breath and snoring.   Endocrine: Negative for heat intolerance and polyphagia.  Hematologic/Lymphatic: Negative for bleeding problem. Does not bruise/bleed easily.  Skin: Negative for flushing, nail changes, rash and suspicious lesions.  Musculoskeletal: Negative for arthritis, joint pain, muscle cramps, myalgias, neck pain and stiffness.  Gastrointestinal: Negative for abdominal pain, bowel incontinence, diarrhea and excessive appetite.  Genitourinary: Negative for decreased libido, genital sores and incomplete emptying.  Neurological: Negative for brief paralysis, focal weakness, headaches and loss of balance.  Psychiatric/Behavioral: Negative for altered mental status, depression and suicidal ideas.  Allergic/Immunologic: Negative for HIV exposure and persistent infections.    EKGs/Labs/Other Studies Reviewed:    The following studies were reviewed today:   EKG: None today  Recent Labs: 09/15/2019: ALT 14 09/17/2019: Magnesium 1.8 09/21/2019: BUN 16; Creatinine, Ser 0.89; Hemoglobin 8.8; Platelets 222; Potassium 3.5; Sodium 136  Recent Lipid Panel      Component Value Date/Time   CHOL 137 10/27/2019 0935   TRIG 64 10/27/2019 0935   HDL 55 10/27/2019 0935   CHOLHDL 2.5 10/27/2019 0935   CHOLHDL 3.7 09/12/2019 0246   VLDL 11 09/12/2019 0246   LDLCALC 69 10/27/2019 0935    Physical Exam:    VS:  BP 96/60 (BP Location: Right Arm, Patient Position: Sitting, Cuff Size: Normal)   Pulse 70   Ht 6' (1.829 m)   Wt 161 lb (73 kg)   SpO2 97%   BMI 21.84 kg/m     Wt Readings from Last 3 Encounters:  09/13/20 161 lb (73 kg)  03/03/20 156 lb (70.8 kg)  02/02/20 154 lb (69.9 kg)     GEN: Well nourished, well developed in no acute distress HEENT: Normal NECK: No JVD; No carotid bruits LYMPHATICS: No lymphadenopathy CARDIAC: S1S2 noted,RRR, no murmurs, rubs, gallops RESPIRATORY:  Clear to auscultation without rales, wheezing or  rhonchi  ABDOMEN: Soft, non-tender, non-distended, +bowel sounds, no guarding. EXTREMITIES: No edema, No cyanosis, no clubbing MUSCULOSKELETAL:  No deformity  SKIN: Warm and dry NEUROLOGIC:  Alert and oriented x 3, non-focal PSYCHIATRIC:  Normal affect, good insight  ASSESSMENT:    1. Benign essential HTN   2. Coronary artery disease involving native coronary artery of native heart without angina pectoris   3. S/P CABG x 3    PLAN:     He is doing well from a cardiovascular standpoint.  He tells me that he is able to do all of his physical activity.  His blood pressure is low in the office today.  We will stop the beta-blocker and hopefully the patient will get more improvement in his blood pressure.  Thankfully he has not had any lightheaded or dizzy spells.  He is concerned about sexual dysfunction since his surgery.  With talking great great deal about this.  And no beta-blocker can cause this on a lower scale.  I am stopping the beta-blocker anyway due to his hypotension.  Of asked the patient if he has improvement from the cessation of beta-blocker that would be great.  If not from my perspective he  will be okay for him to take medication such as Cialis and Viagra.  I also explained to him about the detriments of taking this phosphodiesterase inhibitor (Cialis or Viagra) with nitroglycerin.  He understands that he will need at least 72 hours before taking nitroglycerin if he does take any of this medication.  Hyperlipidemia continue patient his Lipitor.  The patient is in agreement with the above plan. The patient left the office in stable condition.  The patient will follow up in   Medication Adjustments/Labs and Tests Ordered: Current medicines are reviewed at length with the patient today.  Concerns regarding medicines are outlined above.  Orders Placed This Encounter  Procedures  . EKG 12-Lead   No orders of the defined types were placed in this encounter.   Patient Instructions  Medication Instructions:  Your physician has recommended you make the following change in your medication:  STOP METOPROLOL  *If you need a refill on your cardiac medications before your next appointment, please call your pharmacy*   Lab Work: NONE If you have labs (blood work) drawn today and your tests are completely normal, you will receive your results only by: Marland Kitchen MyChart Message (if you have MyChart) OR . A paper copy in the mail If you have any lab test that is abnormal or we need to change your treatment, we will call you to review the results.   Testing/Procedures: NONE   Follow-Up: At [facility], you and your health needs are our priority.  As part of our continuing mission to provide you with exceptional heart care, we have created designated Provider Care Teams.  These Care Teams include your primary Cardiologist (physician) and Advanced Practice Providers (APPs -  Physician Assistants and Nurse Practitioners) who all work together to provide you with the care you need, when you need it.  We recommend signing up for the patient portal called "MyChart".  Sign up information is  provided on this After Visit Summary.  MyChart is used to connect with patients for Virtual Visits (Telemedicine).  Patients are able to view lab/test results, encounter notes, upcoming appointments, etc.  Non-urgent messages can be sent to your provider as well.   To learn more about what you can do with MyChart, go to [email or web address].  Your next appointment:   3 month(s)  The format for your next appointment:   In Person  Provider:   Berniece Salines, DO   Other Instructions      Adopting a Healthy Lifestyle.  Know what a healthy weight is for you (roughly BMI <25) and aim to maintain this   Aim for 7+ servings of fruits and vegetables daily   65-80+ fluid ounces of water or unsweet tea for healthy kidneys   Limit to max 1 drink of alcohol per day; avoid smoking/tobacco   Limit animal fats in diet for cholesterol and heart health - choose grass fed whenever available   Avoid highly processed foods, and foods high in saturated/trans fats   Aim for low stress - take time to unwind and care for your mental health   Aim for 150 min of moderate intensity exercise weekly for heart health, and weights twice weekly for bone health   Aim for 7-9 hours of sleep daily   When it comes to diets, agreement about the perfect plan isnt easy to find, even among the experts. Experts at the [employer] developed an idea known as the Healthy Eating Plate. Just imagine a plate divided into logical, healthy portions.   The emphasis is on diet quality:   Load up on vegetables and fruits - one-half of your plate: Aim for color and variety, and remember that potatoes dont count.   Go for whole grains - one-quarter of your plate: Whole wheat, barley, wheat berries, quinoa, oats, brown rice, and foods made with them. If you want pasta, go with whole wheat pasta.   Protein power - one-quarter of your plate: Fish, chicken, beans, and nuts are all healthy, versatile  protein sources. Limit red meat.   The diet, however, does go beyond the plate, offering a few other suggestions.   Use healthy plant oils, such as olive, canola, soy, corn, sunflower and peanut. Check the labels, and avoid partially hydrogenated oil, which have unhealthy trans fats.   If youre thirsty, drink water. Coffee and tea are good in moderation, but skip sugary drinks and limit milk and dairy products to one or two daily servings.   The type of carbohydrate in the diet is more important than the amount. Some sources of carbohydrates, such as vegetables, fruits, whole grains, and beans-are healthier than others.   Finally, stay active  Signed, Berniece Salines, DO  09/13/2020 3:26 PM    [facility]

## 2020-09-13 NOTE — Patient Instructions (Signed)
Medication Instructions:  Your physician has recommended you make the following change in your medication:  STOP METOPROLOL  *If you need a refill on your cardiac medications before your next appointment, please call your pharmacy*   Lab Work: NONE If you have labs (blood work) drawn today and your tests are completely normal, you will receive your results only by: Marland Kitchen MyChart Message (if you have MyChart) OR . A paper copy in the mail If you have any lab test that is abnormal or we need to change your treatment, we will call you to review the results.   Testing/Procedures: NONE   Follow-Up: At [facility] you and your health needs are our priority.  As part of our continuing mission to provide you with exceptional heart care, we have created designated Provider Care Teams.  These Care Teams include your primary Cardiologist (physician) and Advanced Practice Providers (APPs -  Physician Assistants and Nurse Practitioners) who all work together to provide you with the care you need, when you need it.  We recommend signing up for the patient portal called "MyChart".  Sign up information is provided on this After Visit Summary.  MyChart is used to connect with patients for Virtual Visits (Telemedicine).  Patients are able to view lab/test results, encounter notes, upcoming appointments, etc.  Non-urgent messages can be sent to your provider as well.   To learn more about what you can do with MyChart, go to [email or web address].    Your next appointment:   3 month(s)  The format for your next appointment:   In Person  Provider:   Berniece Salines, DO   Other Instructions

## 2020-12-06 ENCOUNTER — Other Ambulatory Visit: Payer: Self-pay

## 2020-12-07 ENCOUNTER — Other Ambulatory Visit: Payer: Self-pay

## 2020-12-07 ENCOUNTER — Ambulatory Visit (INDEPENDENT_AMBULATORY_CARE_PROVIDER_SITE_OTHER): Payer: Medicare HMO | Admitting: Cardiology

## 2020-12-07 ENCOUNTER — Encounter: Payer: Self-pay | Admitting: Cardiology

## 2020-12-07 VITALS — BP 110/62 | HR 89 | Ht 72.0 in | Wt 165.6 lb

## 2020-12-07 DIAGNOSIS — N528 Other male erectile dysfunction: Secondary | ICD-10-CM | POA: Insufficient documentation

## 2020-12-07 DIAGNOSIS — Z720 Tobacco use: Secondary | ICD-10-CM | POA: Insufficient documentation

## 2020-12-07 DIAGNOSIS — I2510 Atherosclerotic heart disease of native coronary artery without angina pectoris: Secondary | ICD-10-CM | POA: Diagnosis not present

## 2020-12-07 DIAGNOSIS — E782 Mixed hyperlipidemia: Secondary | ICD-10-CM | POA: Insufficient documentation

## 2020-12-07 HISTORY — DX: Other male erectile dysfunction: N52.8

## 2020-12-07 HISTORY — DX: Tobacco use: Z72.0

## 2020-12-07 HISTORY — DX: Mixed hyperlipidemia: E78.2

## 2020-12-07 NOTE — Patient Instructions (Signed)
Medication Instructions:  °No medication changes. °*If you need a refill on your cardiac medications before your next appointment, please call your pharmacy* ° ° °Lab Work: °None ordered °If you have labs (blood work) drawn today and your tests are completely normal, you will receive your results only by: °• MyChart Message (if you have MyChart) OR °• A paper copy in the mail °If you have any lab test that is abnormal or we need to change your treatment, we will call you to review the results. ° ° °Testing/Procedures: °None ordered ° ° °Follow-Up: °At [facility], you and your health needs are our priority.  As part of our continuing mission to provide you with exceptional heart care, we have created designated Provider Care Teams.  These Care Teams include your primary Cardiologist (physician) and Advanced Practice Providers (APPs -  Physician Assistants and Nurse Practitioners) who all work together to provide you with the care you need, when you need it. ° °We recommend signing up for the patient portal called "MyChart".  Sign up information is provided on this After Visit Summary.  MyChart is used to connect with patients for Virtual Visits (Telemedicine).  Patients are able to view lab/test results, encounter notes, upcoming appointments, etc.  Non-urgent messages can be sent to your provider as well.   °To learn more about what you can do with MyChart, go to [email or web address].   ° °Your next appointment:   °12 month(s) ° °The format for your next appointment:   °In Person ° °Provider:   °Kardie Tobb, DO ° ° °Other Instructions °NA ° °

## 2020-12-07 NOTE — Progress Notes (Signed)
Cardiology Office Note:    Date:  12/07/2020   ID:  ELIODORO GULLETT, DOB [birth year], MRN [id number]  PCP:  Street, Sharon Mt, MD  Cardiologist:  Berniece Salines, DO  Electrophysiologist:  None   Referring MD: Street, Sharon Mt, *   " I am doing well"  History of Present Illness:    Russell Harris is a 54 y.o. male with a hx of hypertension, coronary disease status post CABG x3 due to multivessel disease on September 16, 2019, Hodgkin lymphoma, COPD, former tobacco abuser.  At his last visit in September 2021 he complained of sexual dysfunction and he was on low-dose beta-blockers were stopped this medication making sure that this was not his problem.    He is here today for follow-up visit.  I spoke with the patient and he offers no complaints at this time.  He still is having trouble with sexual dysfunction he has not seen his PCP yet. No other complaints at this time.  Past Medical History:  Diagnosis Date  . Abnormal stress test 09/12/2019  . Benign essential HTN 09/12/2019  . Chest pain 09/11/2019  . COPD (chronic obstructive pulmonary disease) ([place])   . Coronary artery disease involving native heart without angina pectoris   . Hodgkin lymphoma ([place])    1997, s/p chemo  . Protein-calorie malnutrition, severe 09/18/2019  . RBBB   . S/P CABG x 3 09/16/2019  . Tobacco abuse     Past Surgical History:  Procedure Laterality Date  . CORONARY ARTERY BYPASS GRAFT N/A 09/16/2019   Procedure: CORONARY ARTERY BYPASS GRAFTING (CABG) x 3 WITH BILATERAL IMA'S AND RIGHT RADIAL ARTERY OPEN HARVESTING.;  Surgeon: Wonda Olds, MD;  Location: [place];  Service: Open Heart Surgery;  Laterality: N/A;  . LEFT HEART CATH AND CORONARY ANGIOGRAPHY N/A 09/12/2019   Procedure: LEFT HEART CATH AND CORONARY ANGIOGRAPHY;  Surgeon: Troy Sine, MD;  Location: [place] [facility];  Service: Cardiovascular;  Laterality: N/A;  . LYMPH NODE BIOPSY     of neck, diagnosed with hodgkin's lymphoma in 1997, s/p  6 month of chemo  . TEE WITHOUT CARDIOVERSION N/A 09/16/2019   Procedure: TRANSESOPHAGEAL ECHOCARDIOGRAM (TEE);  Surgeon: Wonda Olds, MD;  Location: [place];  Service: Open Heart Surgery;  Laterality: N/A;    Current Medications: Current Meds  Medication Sig  . acetaminophen (TYLENOL) 325 MG tablet Take 650 mg by mouth every 6 (six) hours as needed for mild pain or headache.  Marland Kitchen aspirin EC 81 MG EC tablet Take 1 tablet (81 mg total) by mouth daily.  Marland Kitchen atorvastatin (LIPITOR) 80 MG tablet Take 1 tablet (80 mg total) by mouth daily at 6 PM.  . Multiple Vitamin (MULTIVITAMIN WITH MINERALS) TABS tablet Take 1 tablet by mouth daily.  . [DISCONTINUED] metoprolol tartrate (LOPRESSOR) 25 MG tablet Take 25 mg by mouth.     Allergies:   Ketorolac and Tramadol   Social History   Socioeconomic History  . Marital status: Single    Spouse name: Not on file  . Number of children: Not on file  . Years of education: Not on file  . Highest education level: Not on file  Occupational History  . Not on file  Tobacco Use  . Smoking status: Former Smoker    Types: Cigarettes    Quit date: 09/10/2019    Years since quitting: 1.2  . Smokeless tobacco: Never Used  Vaping Use  . Vaping Use: Never used  Substance and  Sexual Activity  . Alcohol use: Yes    Comment: rarely  . Drug use: Yes    Types: Marijuana  . Sexual activity: Not on file  Other Topics Concern  . Not on file  Social History Narrative  . Not on file   Social Determinants of Health   Financial Resource Strain: Not on file  Food Insecurity: Not on file  Transportation Needs: Not on file  Physical Activity: Not on file  Stress: Not on file  Social Connections: Not on file     Family History: The patient's family history includes Aneurysm in his father; Congestive Heart Failure in his mother; Heart attack in his brother and sister.  ROS:   Review of Systems  Constitution: Negative for decreased appetite, fever and weight  gain.  HENT: Negative for congestion, ear discharge, hoarse voice and sore throat.   Eyes: Negative for discharge, redness, vision loss in right eye and visual halos.  Cardiovascular: Negative for chest pain, dyspnea on exertion, leg swelling, orthopnea and palpitations.  Respiratory: Negative for cough, hemoptysis, shortness of breath and snoring.   Endocrine: Negative for heat intolerance and polyphagia.  Hematologic/Lymphatic: Negative for bleeding problem. Does not bruise/bleed easily.  Skin: Negative for flushing, nail changes, rash and suspicious lesions.  Musculoskeletal: Negative for arthritis, joint pain, muscle cramps, myalgias, neck pain and stiffness.  Gastrointestinal: Negative for abdominal pain, bowel incontinence, diarrhea and excessive appetite.  Genitourinary: Negative for decreased libido, genital sores and incomplete emptying.  Neurological: Negative for brief paralysis, focal weakness, headaches and loss of balance.  Psychiatric/Behavioral: Negative for altered mental status, depression and suicidal ideas.  Allergic/Immunologic: Negative for HIV exposure and persistent infections.    EKGs/Labs/Other Studies Reviewed:    The following studies were reviewed today:   EKG:  The ekg ordered today demonstrates sinus rhythm, heart rate 89 bpm with frequent PVCs.  Recent Labs: No results found for requested labs within last 8760 hours.  Recent Lipid Panel    Component Value Date/Time   CHOL 137 10/27/2019 0935   TRIG 64 10/27/2019 0935   HDL 55 10/27/2019 0935   CHOLHDL 2.5 10/27/2019 0935   CHOLHDL 3.7 09/12/2019 0246   VLDL 11 09/12/2019 0246   LDLCALC 69 10/27/2019 0935    Physical Exam:    VS:  BP 110/62   Pulse 89   Ht 6' (1.829 m)   Wt 165 lb 9.6 oz (75.1 kg)   SpO2 96%   BMI 22.46 kg/m     Wt Readings from Last 3 Encounters:  12/07/20 165 lb 9.6 oz (75.1 kg)  09/13/20 161 lb (73 kg)  03/03/20 156 lb (70.8 kg)     GEN: Well nourished, well  developed in no acute distress HEENT: Normal NECK: No JVD; No carotid bruits LYMPHATICS: No lymphadenopathy CARDIAC: S1S2 noted,RRR, no murmurs, rubs, gallops RESPIRATORY:  Clear to auscultation without rales, wheezing or rhonchi  ABDOMEN: Soft, non-tender, non-distended, +bowel sounds, no guarding. EXTREMITIES: No edema, No cyanosis, no clubbing MUSCULOSKELETAL:  No deformity  SKIN: Warm and dry NEUROLOGIC:  Alert and oriented x 3, non-focal PSYCHIATRIC:  Normal affect, good insight  ASSESSMENT:    1. Coronary artery disease involving native coronary artery of native heart without angina pectoris   2. Other male erectile dysfunction   3. Tobacco use   4. Mixed hyperlipidemia    PLAN:     He has had no anginal symptoms.  We will continue the patient on his aspirin and atorvastatin 80 mg  daily.  He has been off beta-blocker.  He tells me he still is experiencing sexual dysfunction.  Dizziness has also resolved.  From my perspective he will be okay for him to take medication such as Cialis and Viagra.  I also explained to him about the detriments of taking this phosphodiesterase inhibitor (Cialis or Viagra) with nitroglycerin.  He understands that he will need at least 72 hours before taking nitroglycerin if he does take any of this medication.  He will see his PCP to discuss prescription for erectile dysfunction.  Hyperlipidemia continue with his Lipitor.  Continued smoking cessation advised.  The patient is in agreement with the above plan. The patient left the office in stable condition.  The patient will follow up in   Medication Adjustments/Labs and Tests Ordered: Current medicines are reviewed at length with the patient today.  Concerns regarding medicines are outlined above.  Orders Placed This Encounter  Procedures  . EKG 12-Lead   No orders of the defined types were placed in this encounter.   Patient Instructions  Medication Instructions:  No medication  changes. *If you need a refill on your cardiac medications before your next appointment, please call your pharmacy*   Lab Work: None ordered If you have labs (blood work) drawn today and your tests are completely normal, you will receive your results only by: Marland Kitchen MyChart Message (if you have MyChart) OR . A paper copy in the mail If you have any lab test that is abnormal or we need to change your treatment, we will call you to review the results.   Testing/Procedures: None ordered   Follow-Up: At [facility], you and your health needs are our priority.  As part of our continuing mission to provide you with exceptional heart care, we have created designated Provider Care Teams.  These Care Teams include your primary Cardiologist (physician) and Advanced Practice Providers (APPs -  Physician Assistants and Nurse Practitioners) who all work together to provide you with the care you need, when you need it.  We recommend signing up for the patient portal called "MyChart".  Sign up information is provided on this After Visit Summary.  MyChart is used to connect with patients for Virtual Visits (Telemedicine).  Patients are able to view lab/test results, encounter notes, upcoming appointments, etc.  Non-urgent messages can be sent to your provider as well.   To learn more about what you can do with MyChart, go to [email or web address].    Your next appointment:   12 month(s)  The format for your next appointment:   In Person  Provider:   Berniece Salines, DO   Other Instructions NA     Adopting a Healthy Lifestyle.  Know what a healthy weight is for you (roughly BMI <25) and aim to maintain this   Aim for 7+ servings of fruits and vegetables daily   65-80+ fluid ounces of water or unsweet tea for healthy kidneys   Limit to max 1 drink of alcohol per day; avoid smoking/tobacco   Limit animal fats in diet for cholesterol and heart health - choose grass fed whenever available    Avoid highly processed foods, and foods high in saturated/trans fats   Aim for low stress - take time to unwind and care for your mental health   Aim for 150 min of moderate intensity exercise weekly for heart health, and weights twice weekly for bone health   Aim for 7-9 hours of sleep daily   When it comes  to diets, agreement about the perfect plan isnt easy to find, even among the experts. Experts at the [employer] developed an idea known as the Healthy Eating Plate. Just imagine a plate divided into logical, healthy portions.   The emphasis is on diet quality:   Load up on vegetables and fruits - one-half of your plate: Aim for color and variety, and remember that potatoes dont count.   Go for whole grains - one-quarter of your plate: Whole wheat, barley, wheat berries, quinoa, oats, brown rice, and foods made with them. If you want pasta, go with whole wheat pasta.   Protein power - one-quarter of your plate: Fish, chicken, beans, and nuts are all healthy, versatile protein sources. Limit red meat.   The diet, however, does go beyond the plate, offering a few other suggestions.   Use healthy plant oils, such as olive, canola, soy, corn, sunflower and peanut. Check the labels, and avoid partially hydrogenated oil, which have unhealthy trans fats.   If youre thirsty, drink water. Coffee and tea are good in moderation, but skip sugary drinks and limit milk and dairy products to one or two daily servings.   The type of carbohydrate in the diet is more important than the amount. Some sources of carbohydrates, such as vegetables, fruits, whole grains, and beans-are healthier than others.   Finally, stay active  Signed, Berniece Salines, DO  12/07/2020 9:24 AM    [place]

## 2021-06-11 IMAGING — DX DG CHEST 1V PORT
1 series · 1 of 1 positions shown · non-contrast
Comparison: Yesterday

CLINICAL DATA: Chest tube

EXAM:
PORTABLE CHEST 1 VIEW

[chest ap]
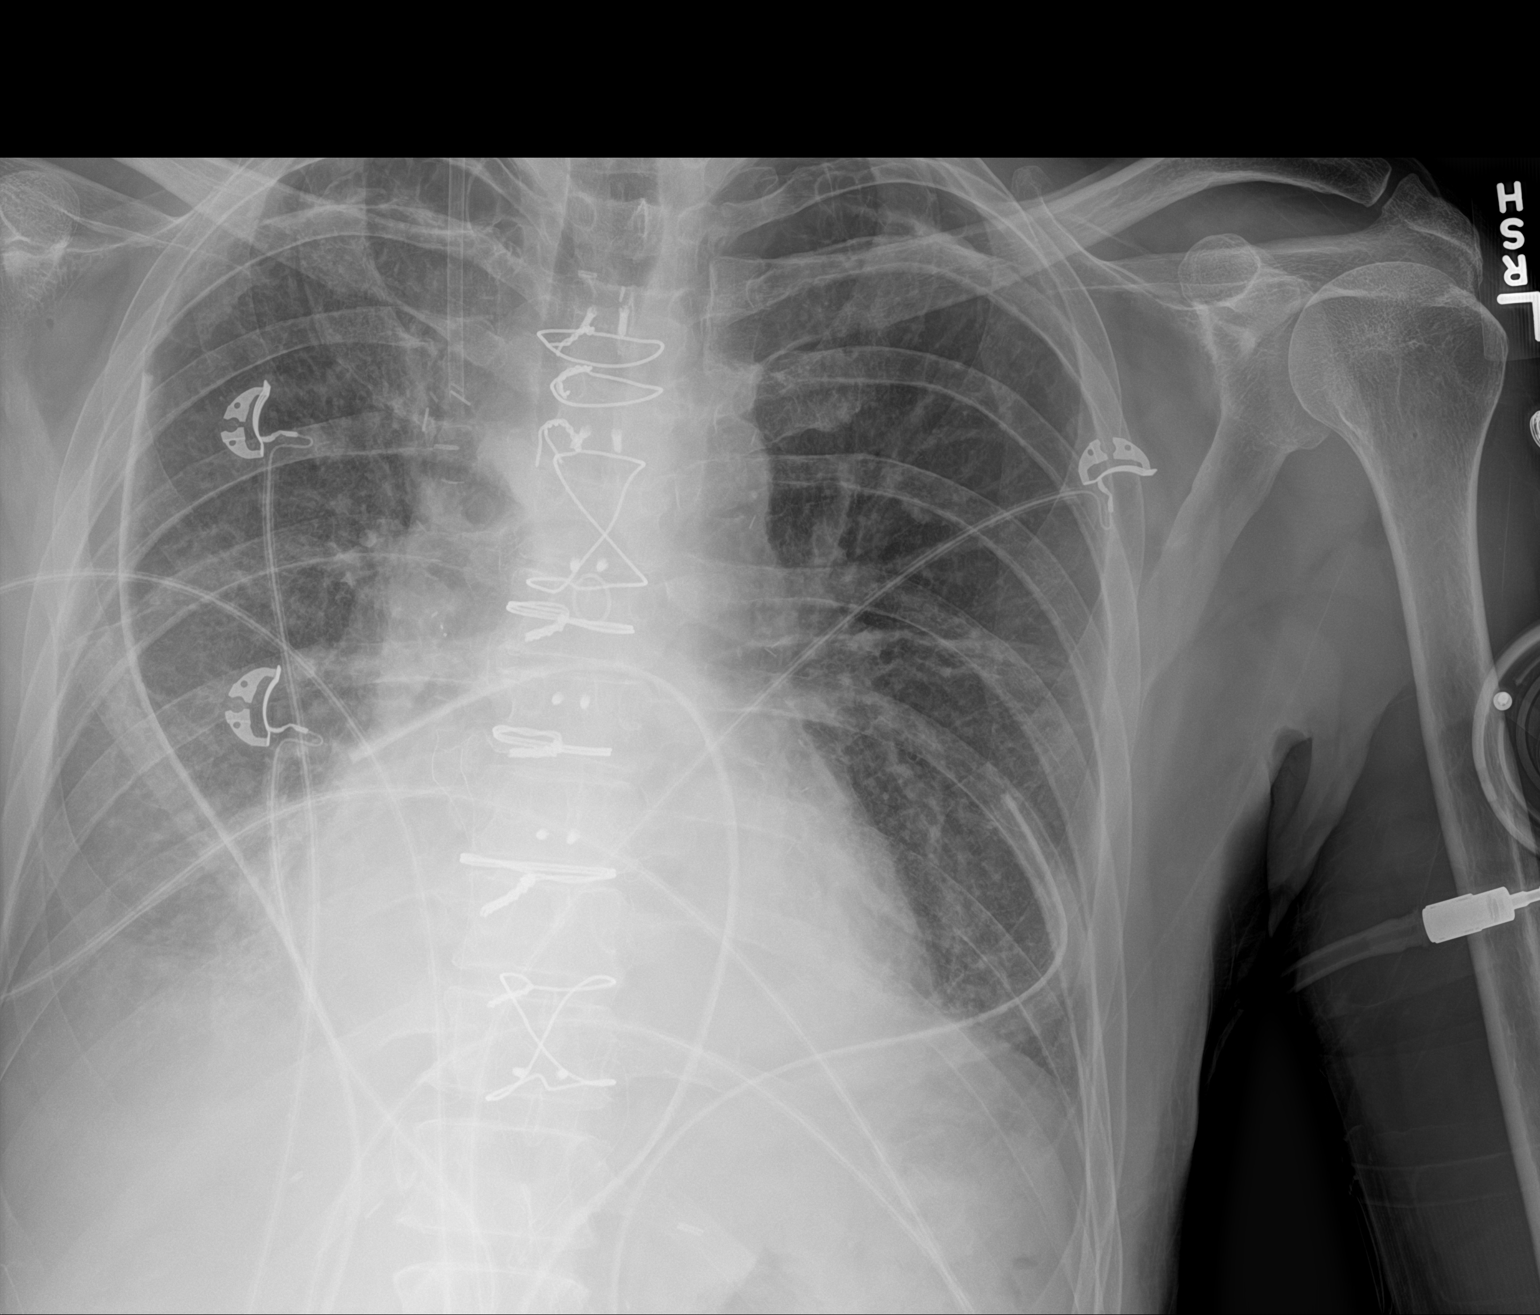

[1 of 1 positions shown; findings below may reference images not displayed]

FINDINGS: Chest tubes in place. Small biapical pneumothorax, unchanged. Right
IJ sheath in stable position. Stable heart size. Stable atelectasis
and right pleural fluid.
IMPRESSION: Unchanged small biapical pneumothorax.

Stable atelectasis and right pleural fluid.

## 2021-06-18 IMAGING — CR DG CHEST 2V
2 series · 2 of 2 positions shown · non-contrast
Comparison: 09/19/2019

CLINICAL DATA: Status post CABG

EXAM:
CHEST - 2 VIEW

[w chest pa]
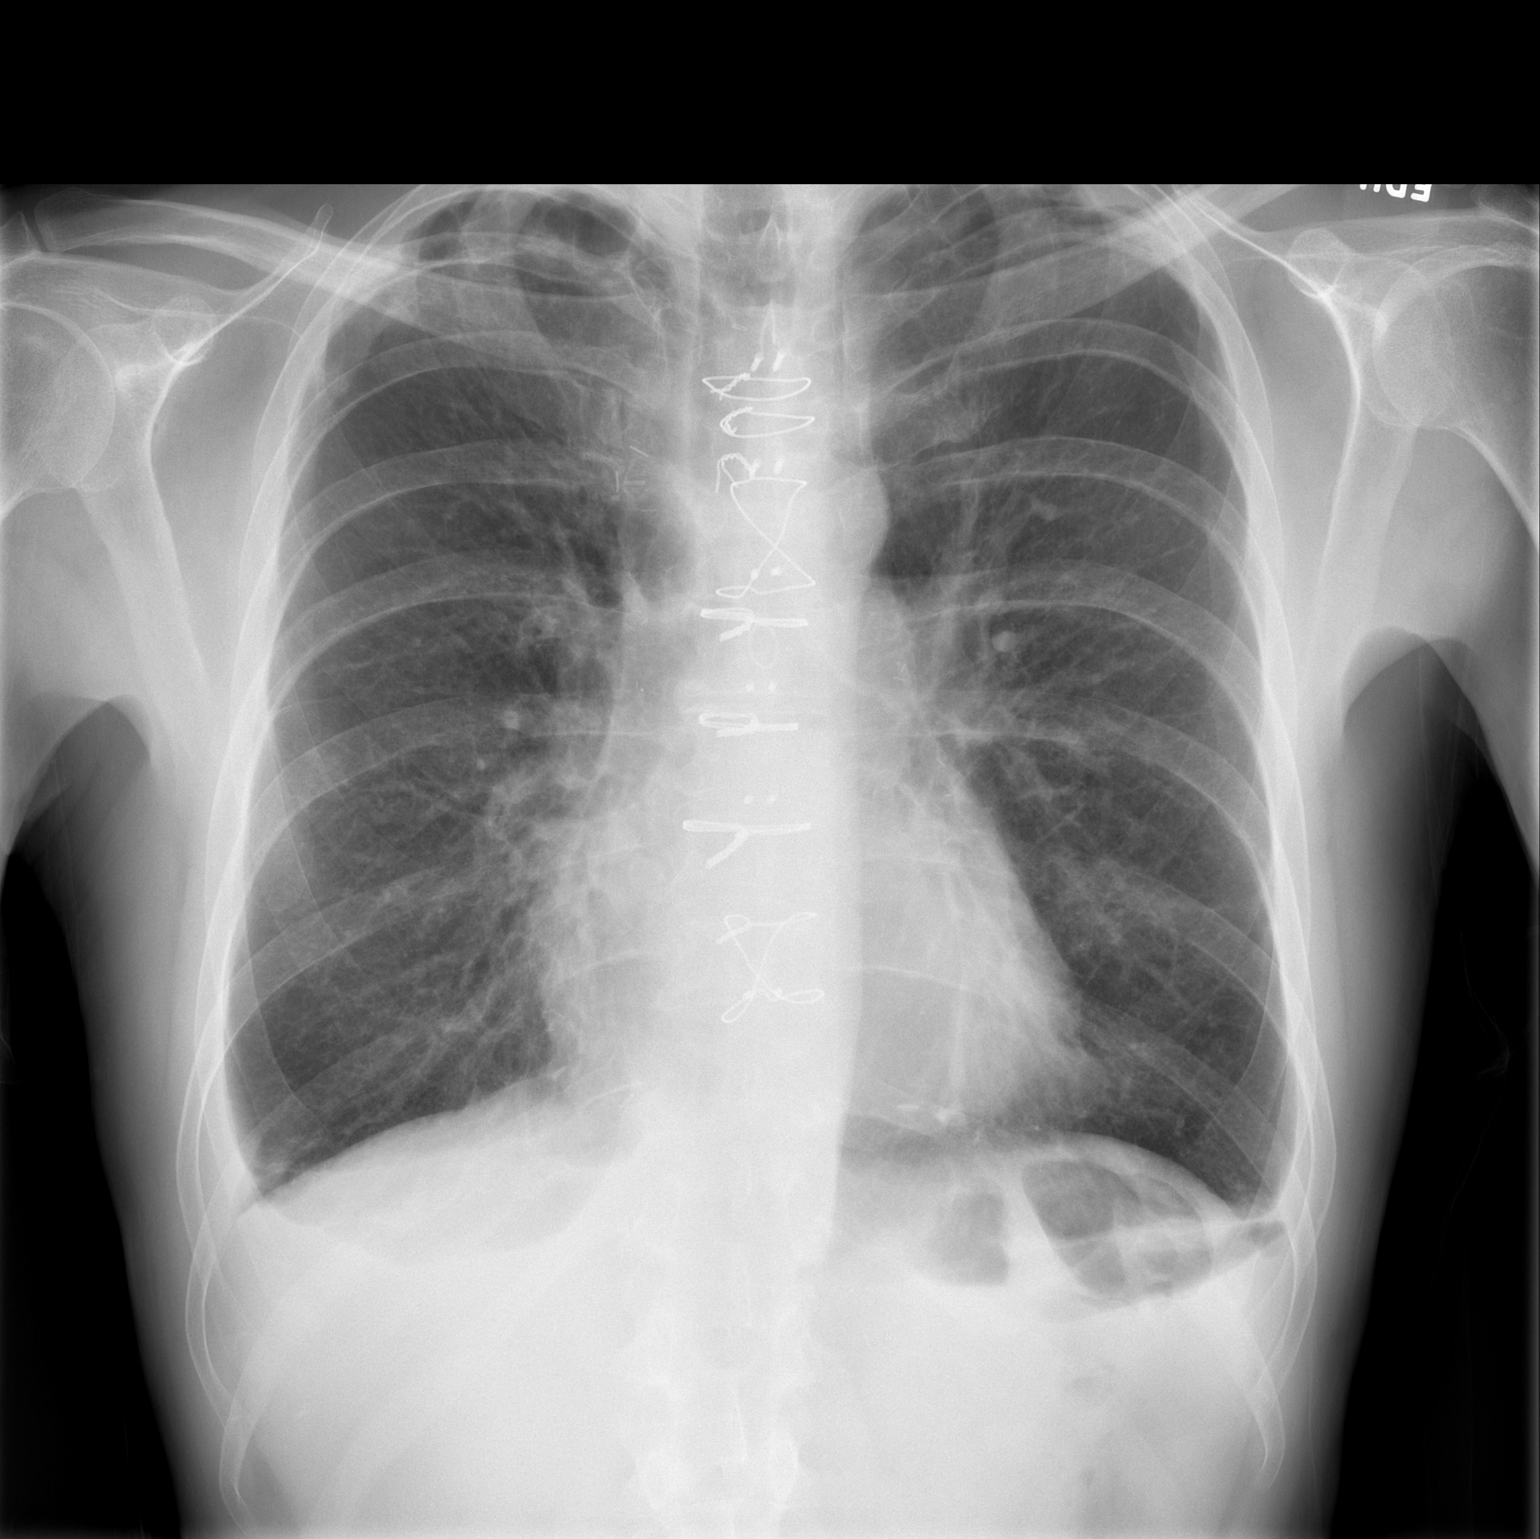

[w chest lat]
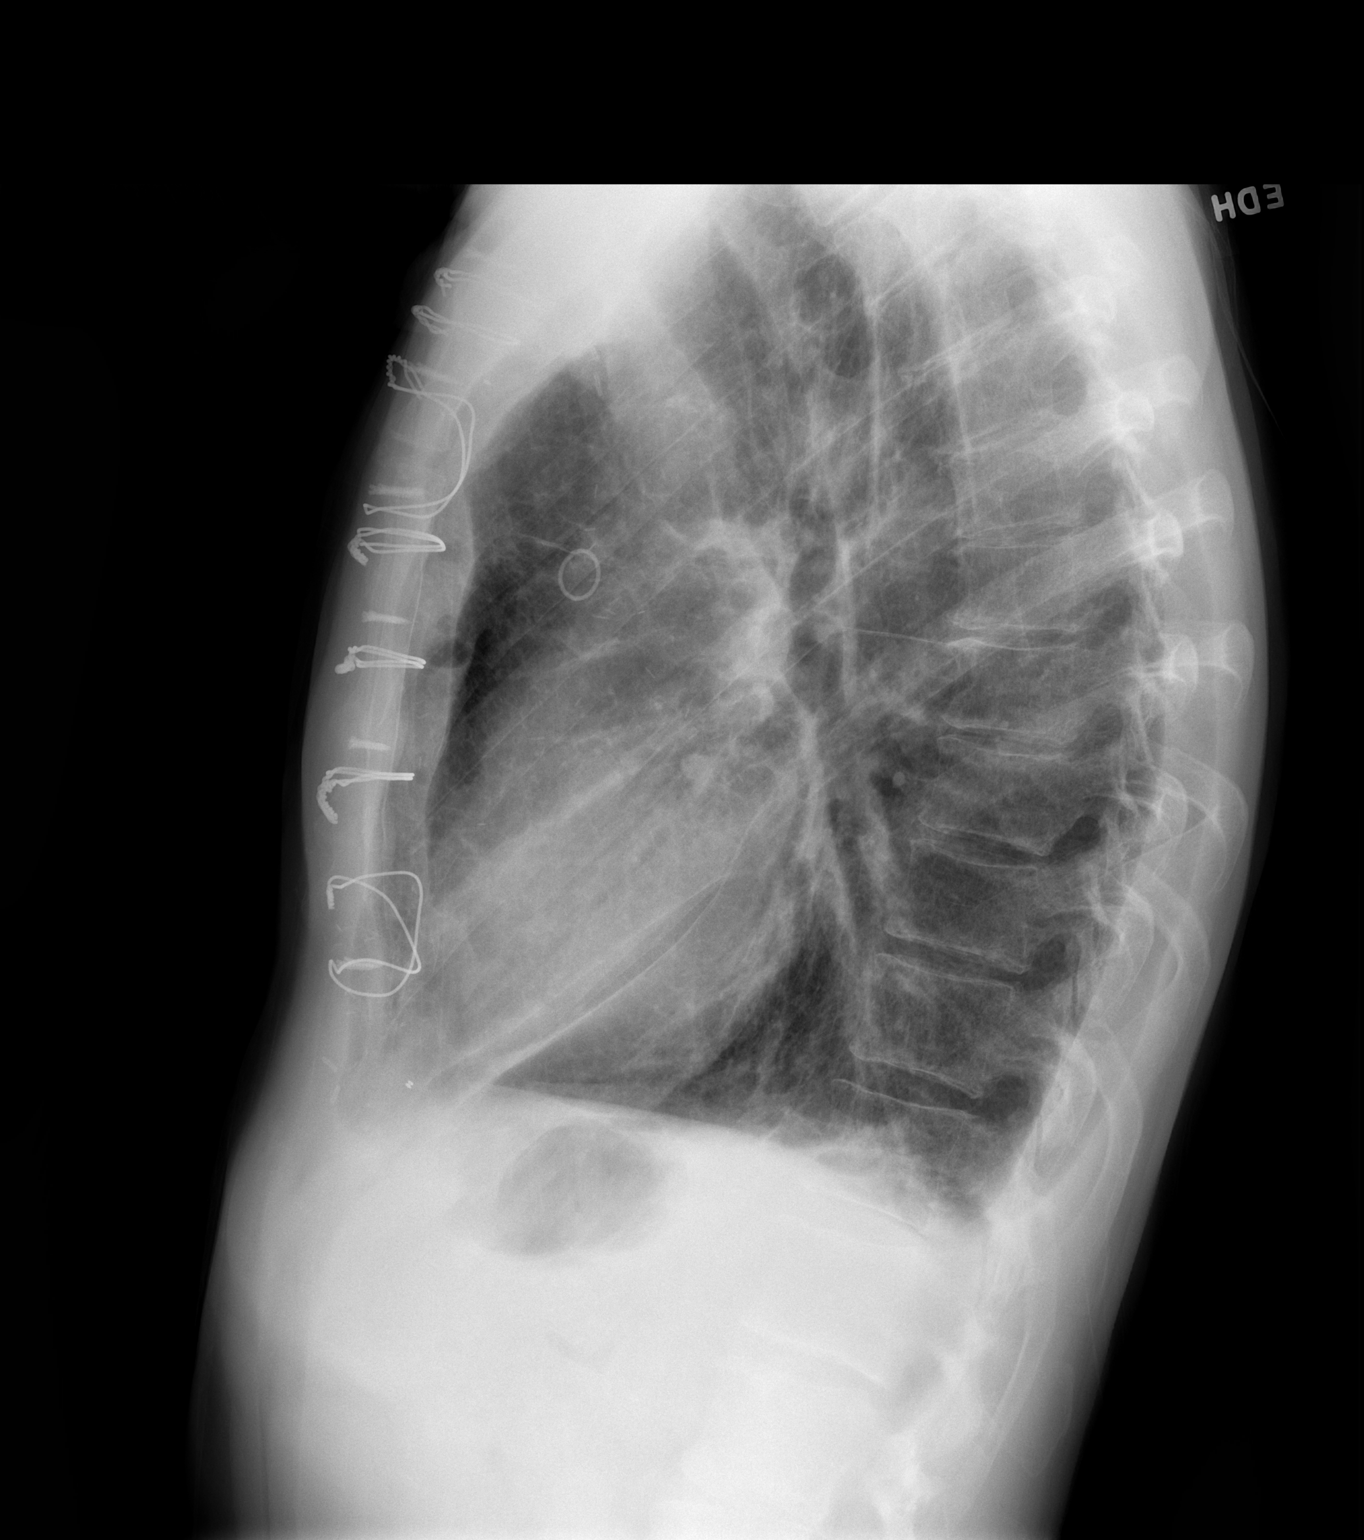

[2 of 2 positions shown; findings below may reference images not displayed]

FINDINGS: Signs of CABG and sternotomy with normal cardiomediastinal contours
otherwise.

Blebs are noted in the bilateral lung apices without visible
pneumothorax on the current study.

No consolidation.  Lungs are hyperinflated.
IMPRESSION: Hyperinflation without signs pneumothorax or consolidation.

## 2022-04-19 ENCOUNTER — Encounter: Payer: Self-pay | Admitting: Cardiology

## 2022-04-19 ENCOUNTER — Ambulatory Visit (INDEPENDENT_AMBULATORY_CARE_PROVIDER_SITE_OTHER): Payer: Medicare HMO | Admitting: Cardiology

## 2022-04-19 VITALS — BP 128/80 | HR 121 | Ht 71.0 in | Wt 169.6 lb

## 2022-04-19 DIAGNOSIS — Z951 Presence of aortocoronary bypass graft: Secondary | ICD-10-CM | POA: Diagnosis not present

## 2022-04-19 DIAGNOSIS — I2510 Atherosclerotic heart disease of native coronary artery without angina pectoris: Secondary | ICD-10-CM | POA: Diagnosis not present

## 2022-04-19 DIAGNOSIS — E782 Mixed hyperlipidemia: Secondary | ICD-10-CM

## 2022-04-19 MED ORDER — ASPIRIN EC 81 MG PO TBEC: 81.0000 mg | DELAYED_RELEASE_TABLET | Freq: Every day | ORAL | 3 refills | Status: AC

## 2022-04-19 MED ORDER — NITROGLYCERIN 0.4 MG SL SUBL: 0.4000 mg | SUBLINGUAL_TABLET | SUBLINGUAL | 6 refills | Status: DC | PRN

## 2022-04-19 NOTE — Progress Notes (Signed)
?Cardiology Office Note:   ? ?Date:  04/19/2022  ? ?ID:  Russell Harris, DOB 1966-10-26, MRN [id number] ? ?PCP:  Street, Sharon Mt, MD  ?Cardiologist:  Jenean Lindau, MD  ? ?Referring MD: Street, Sharon Mt, *  ? ? ?ASSESSMENT:   ? ?1. Coronary artery disease involving native coronary artery of native heart without angina pectoris   ?2. S/P CABG x 3   ?3. Mixed hyperlipidemia   ? ?PLAN:   ? ?In order of problems listed above: ? ?Coronary artery disease: Secondary prevention stressed to the patient.  Importance of compliance with diet medication stressed and vocalized understanding.  He was advised to take a coated baby aspirin on a daily basis. ?Sinus tachycardia: Unclear etiology.  I will bring him back for complete blood work in the next several days.  This may be related to the fact that he walked here for a couple of blocks.  He also has history of COPD.  We will do TSH and CBC also with complete blood work-up. ?I will get blood work on him and initiate him on statin therapy once I get a complete blood work.  This will be for secondary prevention. ?Echocardiogram will be done to assess murmur heard on auscultation.  I will also get vital signs again when he comes for echocardiogram. ?Patient will be seen in follow-up appointment in 6 months or earlier if the patient has any concerns ? ? ? ?Medication Adjustments/Labs and Tests Ordered: ?Current medicines are reviewed at length with the patient today.  Concerns regarding medicines are outlined above.  ?No orders of the defined types were placed in this encounter. ? ?No orders of the defined types were placed in this encounter. ? ? ? ?No chief complaint on file. ?  ? ?History of Present Illness:   ? ?Russell Harris is a 56 y.o. male.  Patient is previously unknown to me.  He has seen my partner who has transitioned to practice in [place].  He has history of coronary artery disease post CABG surgery.  Currently is not taking any medications.  He denies any  chest pain orthopnea or PND.  At the time of my evaluation, the patient is alert awake oriented and in no distress.  He has not been very compliant with medical advice.  He tells me that he has been a long time since he had blood work. ? ?Past Medical History:  ?Diagnosis Date  ? Abnormal stress test 09/12/2019  ? Benign essential HTN 09/12/2019  ? Chest pain 09/11/2019  ? COPD (chronic obstructive pulmonary disease) ([place])   ? Coronary artery disease involving native heart without angina pectoris   ? Hodgkin lymphoma ([place])   ? 1997, s/p chemo  ? Mixed hyperlipidemia 12/07/2020  ? Other male erectile dysfunction 12/07/2020  ? Protein-calorie malnutrition, severe 09/18/2019  ? RBBB   ? S/P CABG x 3 09/16/2019  ? Tobacco abuse   ? Tobacco use 12/07/2020  ? ? ?Past Surgical History:  ?Procedure Laterality Date  ? CORONARY ARTERY BYPASS GRAFT N/A 09/16/2019  ? Procedure: CORONARY ARTERY BYPASS GRAFTING (CABG) x 3 WITH BILATERAL IMA'S AND RIGHT RADIAL ARTERY OPEN HARVESTING.;  Surgeon: Wonda Olds, MD;  Location: [place];  Service: Open Heart Surgery;  Laterality: N/A;  ? LEFT HEART CATH AND CORONARY ANGIOGRAPHY N/A 09/12/2019  ? Procedure: LEFT HEART CATH AND CORONARY ANGIOGRAPHY;  Surgeon: Troy Sine, MD;  Location: [place] [facility];  Service: Cardiovascular;  Laterality: N/A;  ?  LYMPH NODE BIOPSY    ? of neck, diagnosed with hodgkin's lymphoma in 1997, s/p 6 month of chemo  ? TEE WITHOUT CARDIOVERSION N/A 09/16/2019  ? Procedure: TRANSESOPHAGEAL ECHOCARDIOGRAM (TEE);  Surgeon: Wonda Olds, MD;  Location: [facility];  Service: Open Heart Surgery;  Laterality: N/A;  ? ? ?Current Medications: ?Current Meds  ?Medication Sig  ? Multiple Vitamin (MULTIVITAMIN WITH MINERALS) TABS tablet Take 1 tablet by mouth daily.  ?  ? ?Allergies:   Ketorolac, Tramadol, and Lipitor [atorvastatin]  ? ?Social History  ? ?Socioeconomic History  ? Marital status: Single  ?  Spouse name: Not on file  ? Number of children: Not on file  ?  Years of education: Not on file  ? Highest education level: Not on file  ?Occupational History  ? Not on file  ?Tobacco Use  ? Smoking status: Former  ?  Types: Cigarettes  ?  Quit date: 09/10/2019  ?  Years since quitting: 2.6  ? Smokeless tobacco: Never  ?Vaping Use  ? Vaping Use: Never used  ?Substance and Sexual Activity  ? Alcohol use: Yes  ?  Comment: rarely  ? Drug use: Yes  ?  Types: Marijuana  ? Sexual activity: Not on file  ?Other Topics Concern  ? Not on file  ?Social History Narrative  ? Not on file  ? ?Social Determinants of Health  ? ?Financial Resource Strain: Not on file  ?Food Insecurity: Not on file  ?Transportation Needs: Not on file  ?Physical Activity: Not on file  ?Stress: Not on file  ?Social Connections: Not on file  ?  ? ?Family History: ?The patient's family history includes Aneurysm in his father; Congestive Heart Failure in his mother; Heart attack in his brother and sister. ? ?ROS:   ?Please see the history of present illness.    ?All other systems reviewed and are negative. ? ?EKGs/Labs/Other Studies Reviewed:   ? ?The following studies were reviewed today: ?I discussed my findings with the patient at length.  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 ? ?Recent Labs: ?No results found for requested labs within last 8760 hours.  ?Recent Lipid Panel ?   ?Component Value Date/Time  ? CHOL 137 10/27/2019 0935  ? TRIG 64 10/27/2019 0935  ? HDL 55 10/27/2019 0935  ? CHOLHDL 2.5 10/27/2019 0935  ? CHOLHDL 3.7 09/12/2019 0246  ? VLDL 11 09/12/2019 0246  ? [place] 69 10/27/2019 0935  ? ? ?Physical Exam:   ? ?VS:  BP 128/80   Pulse (!) 121   Ht '5\' 11"'$  (1.803 m)   Wt 169 lb 9.6 oz (76.9 kg)   SpO2 96%   BMI 23.65 kg/m?    ? ?Wt Readings from Last 3 Encounters:  ?04/19/22 169 lb 9.6 oz (76.9 kg)  ?12/07/20 165 lb 9.6 oz (75.1 kg)  ?09/13/20 161 lb (73 kg)  ?  ? ?GEN: Patient is in no acute distress ?HEENT: Normal ?NECK:  No JVD; No carotid bruits ?LYMPHATICS: No lymphadenopathy ?CARDIAC: Hear sounds regular, 2/6 systolic murmur at the apex. ?RESPIRATORY:  Clear to auscultation without rales, wheezing or rhonchi  ?ABDOMEN: Soft, non-tender, non-distended ?MUSCULOSKELETAL:  No edema; No deformity  ?SKIN: Warm and dry ?NEUROLOGIC:  Alert and oriented x 3 ?PSYCHIATRIC:  Normal affect  ? ?Signed, ?Jenean Lindau, MD  ?04/19/2022 4:09 PM    ?[place]  ?

## 2022-04-19 NOTE — Addendum Note (Signed)
Addended by: Truddie Hidden on: 04/19/2022 04:53 PM ? ? Modules accepted: Orders ? ?

## 2022-04-19 NOTE — Patient Instructions (Signed)
Medication Instructions:  ?Your physician has recommended you make the following change in your medication:  ? ?Start taking 81 mg coated aspirin daily. ? ?Use nitroglycerin 1 tablet placed under the tongue at the first sign of chest pain or an angina attack. 1 tablet may be used every 5 minutes as needed, for up to 15 minutes. Do not take more than 3 tablets in 15 minutes. If pain persist call 911 or go to the nearest ED. ? ? ?*If you need a refill on your cardiac medications before your next appointment, please call your pharmacy* ? ? ?Lab Work: ?Your physician recommends that you return for lab work in: the next few days. ?You need to have labs done when you are fasting.  You can come Monday through Friday 8:30 am to 12:00 pm and 1:15 to 4:30. You do not need to make an appointment as the order has already been placed. The labs you are going to have done are BMET, CBC, TSH, LFT and Lipids. ? ?If you have labs (blood work) drawn today and your tests are completely normal, you will receive your results only by: ?MyChart Message (if you have MyChart) OR ?A paper copy in the mail ?If you have any lab test that is abnormal or we need to change your treatment, we will call you to review the results. ? ? ?Testing/Procedures: ?Your physician has requested that you have an echocardiogram. Echocardiography is a painless test that uses sound waves to create images of your heart. It provides your doctor with information about the size and shape of your heart and how well your heart?s chambers and valves are working. This procedure takes approximately one hour. There are no restrictions for this procedure. ? ? ? ?Follow-Up: ?At [facility], you and your health needs are our priority.  As part of our continuing mission to provide you with exceptional heart care, we have created designated Provider Care Teams.  These Care Teams include your primary Cardiologist (physician) and Advanced Practice Providers (APPs -  Physician  Assistants and Nurse Practitioners) who all work together to provide you with the care you need, when you need it. ? ?We recommend signing up for the patient portal called "MyChart".  Sign up information is provided on this After Visit Summary.  MyChart is used to connect with patients for Virtual Visits (Telemedicine).  Patients are able to view lab/test results, encounter notes, upcoming appointments, etc.  Non-urgent messages can be sent to your provider as well.   ?To learn more about what you can do with MyChart, go to [email or web address].   ? ?Your next appointment:   ?3 month(s) ? ?The format for your next appointment:   ?In Person ? ?Provider:   ?Jyl Heinz, MD ? ? ?Other Instructions ?Echocardiogram ?An echocardiogram is a test that uses sound waves (ultrasound) to produce images of the heart. ?Images from an echocardiogram can provide important information about: ?Heart size and shape. ?The size and thickness and movement of your heart's walls. ?Heart muscle function and strength. ?Heart valve function or if you have stenosis. Stenosis is when the heart valves are too narrow. ?If blood is flowing backward through the heart valves (regurgitation). ?A tumor or infectious growth around the heart valves. ?Areas of heart muscle that are not working well because of poor blood flow or injury from a heart attack. ?Aneurysm detection. An aneurysm is a weak or damaged part of an artery wall. The wall bulges out from the normal force of blood  pumping through the body. ?Tell a health care provider about: ?Any allergies you have. ?All medicines you are taking, including vitamins, herbs, eye drops, creams, and over-the-counter medicines. ?Any blood disorders you have. ?Any surgeries you have had. ?Any medical conditions you have. ?Whether you are pregnant or may be pregnant. ?What are the risks? ?Generally, this is a safe test. However, problems may occur, including an allergic reaction to dye (contrast) that  may be used during the test. ?What happens before the test? ?No specific preparation is needed. You may eat and drink normally. ?What happens during the test? ?You will take off your clothes from the waist up and put on a hospital gown. ?Electrodes or electrocardiogram (ECG)patches may be placed on your chest. The electrodes or patches are then connected to a device that monitors your heart rate and rhythm. ?You will lie down on a table for an ultrasound exam. A gel will be applied to your chest to help sound waves pass through your skin. ?A handheld device, called a transducer, will be pressed against your chest and moved over your heart. The transducer produces sound waves that travel to your heart and bounce back (or "echo" back) to the transducer. These sound waves will be captured in real-time and changed into images of your heart that can be viewed on a video monitor. The images will be recorded on a computer and reviewed by your health care provider. ?You may be asked to change positions or hold your breath for a short time. This makes it easier to get different views or better views of your heart. ?In some cases, you may receive contrast through an IV in one of your veins. This can improve the quality of the pictures from your heart. ?The procedure may vary among health care providers and hospitals.   ?What can I expect after the test? ?You may return to your normal, everyday life, including diet, activities, and medicines, unless your health care provider tells you not to do that. ?Follow these instructions at home: ?It is up to you to get the results of your test. Ask your health care provider, or the department that is doing the test, when your results will be ready. ?Keep all follow-up visits. This is important. ?Summary ?An echocardiogram is a test that uses sound waves (ultrasound) to produce images of the heart. ?Images from an echocardiogram can provide important information about the size and shape  of your heart, heart muscle function, heart valve function, and other possible heart problems. ?You do not need to do anything to prepare before this test. You may eat and drink normally. ?After the echocardiogram is completed, you may return to your normal, everyday life, unless your health care provider tells you not to do that. ?This information is not intended to replace advice given to you by your health care provider. Make sure you discuss any questions you have with your health care provider. ?Document Revised: 08/03/2020 Document Reviewed: 08/03/2020 ?Elsevier Patient Education ? [place]. ? ? ?

## 2022-04-20 DIAGNOSIS — E782 Mixed hyperlipidemia: Secondary | ICD-10-CM | POA: Diagnosis not present

## 2022-04-20 DIAGNOSIS — I2510 Atherosclerotic heart disease of native coronary artery without angina pectoris: Secondary | ICD-10-CM | POA: Diagnosis not present

## 2022-04-20 DIAGNOSIS — Z951 Presence of aortocoronary bypass graft: Secondary | ICD-10-CM | POA: Diagnosis not present

## 2022-04-21 ENCOUNTER — Telehealth: Payer: Self-pay

## 2022-04-21 DIAGNOSIS — E782 Mixed hyperlipidemia: Secondary | ICD-10-CM

## 2022-04-21 DIAGNOSIS — I2510 Atherosclerotic heart disease of native coronary artery without angina pectoris: Secondary | ICD-10-CM

## 2022-04-21 LAB — CBC WITH DIFFERENTIAL/PLATELET
Basophils Absolute: 0.1 10*3/uL (ref 0.0–0.2)
Basos: 1 %
EOS (ABSOLUTE): 0.2 10*3/uL (ref 0.0–0.4)
Eos: 3 %
Hematocrit: 45.6 % (ref 37.5–51.0)
Hemoglobin: 15.5 g/dL (ref 13.0–17.7)
Immature Grans (Abs): 0 10*3/uL (ref 0.0–0.1)
Immature Granulocytes: 0 %
Lymphocytes Absolute: 2.5 10*3/uL (ref 0.7–3.1)
Lymphs: 30 %
MCH: 31.3 pg (ref 26.6–33.0)
MCHC: 34 g/dL (ref 31.5–35.7)
MCV: 92 fL (ref 79–97)
Monocytes Absolute: 0.9 10*3/uL (ref 0.1–0.9)
Monocytes: 11 %
Neutrophils Absolute: 4.7 10*3/uL (ref 1.4–7.0)
Neutrophils: 55 %
Platelets: 194 10*3/uL (ref 150–450)
RBC: 4.96 x10E6/uL (ref 4.14–5.80)
RDW: 12.6 % (ref 11.6–15.4)
WBC: 8.4 10*3/uL (ref 3.4–10.8)

## 2022-04-21 LAB — TSH: TSH: 2.37 u[IU]/mL (ref 0.450–4.500)

## 2022-04-21 LAB — BASIC METABOLIC PANEL
BUN/Creatinine Ratio: 13 (ref 9–20)
BUN: 16 mg/dL (ref 6–24)
CO2: 23 mmol/L (ref 20–29)
Calcium: 10 mg/dL (ref 8.7–10.2)
Chloride: 101 mmol/L (ref 96–106)
Creatinine, Ser: 1.2 mg/dL (ref 0.76–1.27)
Glucose: 88 mg/dL (ref 70–99)
Potassium: 4.2 mmol/L (ref 3.5–5.2)
Sodium: 139 mmol/L (ref 134–144)
eGFR: 70 mL/min/{1.73_m2} (ref >59)

## 2022-04-21 LAB — HEPATIC FUNCTION PANEL
ALT: 18 IU/L (ref 0–44)
AST: 23 IU/L (ref 0–40)
Albumin: 4.8 g/dL (ref 3.8–4.9)
Alkaline Phosphatase: 68 IU/L (ref 44–121)
Bilirubin Total: 1.4 mg/dL — ABNORMAL HIGH (ref 0.0–1.2)
Bilirubin, Direct: 0.28 mg/dL (ref 0.00–0.40)
Total Protein: 7.1 g/dL (ref 6.0–8.5)

## 2022-04-21 LAB — LIPID PANEL
Chol/HDL Ratio: 3.6 ratio (ref 0.0–5.0)
Cholesterol, Total: 214 mg/dL — ABNORMAL HIGH (ref 100–199)
HDL: 60 mg/dL (ref >39)
LDL Chol Calc (NIH): 142 mg/dL — ABNORMAL HIGH (ref 0–99)
Triglycerides: 65 mg/dL (ref 0–149)
VLDL Cholesterol Cal: 12 mg/dL (ref 5–40)

## 2022-04-21 NOTE — Telephone Encounter (Signed)
Lipid/Liver fx ordered per Dr. Julien Nordmann note ?

## 2022-04-28 ENCOUNTER — Ambulatory Visit (INDEPENDENT_AMBULATORY_CARE_PROVIDER_SITE_OTHER): Payer: Medicare HMO

## 2022-04-28 VITALS — BP 104/60 | HR 65 | Ht 71.0 in | Wt 171.0 lb

## 2022-04-28 DIAGNOSIS — Z951 Presence of aortocoronary bypass graft: Secondary | ICD-10-CM | POA: Diagnosis not present

## 2022-04-28 DIAGNOSIS — R000 Tachycardia, unspecified: Secondary | ICD-10-CM | POA: Diagnosis not present

## 2022-04-28 DIAGNOSIS — I2510 Atherosclerotic heart disease of native coronary artery without angina pectoris: Secondary | ICD-10-CM | POA: Diagnosis not present

## 2022-04-28 LAB — ECHOCARDIOGRAM COMPLETE
Area-P 1/2: 3.68 cm2
Height: 71 in
S' Lateral: 4.4 cm
Weight: 2736 oz

## 2022-04-29 LAB — CBC
Hematocrit: 42.7 % (ref 37.5–51.0)
Hemoglobin: 14.2 g/dL (ref 13.0–17.7)
MCH: 31.4 pg (ref 26.6–33.0)
MCHC: 33.3 g/dL (ref 31.5–35.7)
MCV: 95 fL (ref 79–97)
Platelets: 180 10*3/uL (ref 150–450)
RBC: 4.52 x10E6/uL (ref 4.14–5.80)
RDW: 13 % (ref 11.6–15.4)
WBC: 8.7 10*3/uL (ref 3.4–10.8)

## 2022-04-29 LAB — TSH: TSH: 2.38 u[IU]/mL (ref 0.450–4.500)

## 2022-05-02 NOTE — Progress Notes (Deleted)
? ?  Nurse Visit  ? ?Date of Encounter: 05/02/2022 ?ID: Russell Harris, DOB Jul 30, 1966, MRN [id number] ? ?PCP:  Street, Sharon Mt, MD ?  ?[facility] Providers ?Cardiologist:  Berniece Salines, DO { ?Click to update primary MD,subspecialty MD or APP then REFRESH:1}   ? ? ?Visit Details  ? ?VS:  BP 104/60 (BP Location: Left Arm, Patient Position: Sitting)   Pulse 65   Ht '5\' 11"'$  (1.803 m)   Wt 171 lb (77.6 kg)   SpO2 96%   BMI 23.85 kg/m?  , BMI Body mass index is 23.85 kg/m?. ? ?Wt Readings from Last 3 Encounters:  ?04/28/22 171 lb (77.6 kg)  ?04/19/22 169 lb 9.6 oz (76.9 kg)  ?12/07/20 165 lb 9.6 oz (75.1 kg)  ?  ? ?Reason for visit: *** ?Performed today: {Blank multiple:19196::"Vitals","EKG","Provider consulted:***","Education"} ?Changes (medications, testing, etc.) : *** ?Length of Visit: *** minutes ? ? ? ?Medications Adjustments/Labs and Tests Ordered: ?Orders Placed This Encounter  ?Procedures  ? CBC  ? TSH  ? EKG 12-Lead  ? ?No orders of the defined types were placed in this encounter. ? ? ? ?Signed, ?Tyler Pita, RN  ?05/02/2022 1:02 PM  ?

## 2022-05-05 NOTE — Progress Notes (Unsigned)
   Nurse Visit   Date of Encounter: 05/05/2022 ID: ALDIN DREES, DOB Apr 20, 1966, MRN [id number]  PCP:  Street, Sharon Mt, MD   [facility] Providers Cardiologist:  Berniece Salines, DO { Click to update primary MD,subspecialty MD or APP then REFRESH:1}     Visit Details   VS:  BP 104/60 (BP Location: Left Arm, Patient Position: Sitting)   Pulse 65   Ht '5\' 11"'$  (1.803 m)   Wt 171 lb (77.6 kg)   SpO2 96%   BMI 23.85 kg/m  , BMI Body mass index is 23.85 kg/m.  Wt Readings from Last 3 Encounters:  04/28/22 171 lb (77.6 kg)  04/19/22 169 lb 9.6 oz (76.9 kg)  12/07/20 165 lb 9.6 oz (75.1 kg)     Reason for visit: Vitals and EKG per Dr. Geraldo Pitter Performed today: Vitals, EKG, Education, Dr. Agustin Cree consulted. Changes (medications, testing, etc.) : No changes Length of Visit: 20 minutes    Medications Adjustments/Labs and Tests Ordered: Orders Placed This Encounter  Procedures   CBC   TSH   EKG 12-Lead   No changes were made. Pt had no questions or concerns. He will follow up as scheduled.     Signed, Tyler Pita, RN  05/05/2022 12:36 PM

## 2022-05-10 ENCOUNTER — Encounter: Payer: Self-pay | Admitting: Cardiology

## 2022-06-07 DIAGNOSIS — I2510 Atherosclerotic heart disease of native coronary artery without angina pectoris: Secondary | ICD-10-CM | POA: Diagnosis not present

## 2022-06-07 DIAGNOSIS — E782 Mixed hyperlipidemia: Secondary | ICD-10-CM | POA: Diagnosis not present

## 2022-06-07 LAB — HEPATIC FUNCTION PANEL
ALT: 16 IU/L (ref 0–44)
AST: 19 IU/L (ref 0–40)
Albumin: 4.5 g/dL (ref 3.8–4.9)
Alkaline Phosphatase: 70 IU/L (ref 44–121)
Bilirubin Total: 1.1 mg/dL (ref 0.0–1.2)
Bilirubin, Direct: 0.23 mg/dL (ref 0.00–0.40)
Total Protein: 6.8 g/dL (ref 6.0–8.5)

## 2022-06-07 LAB — LIPID PANEL
Chol/HDL Ratio: 3.7 ratio (ref 0.0–5.0)
Cholesterol, Total: 187 mg/dL (ref 100–199)
HDL: 51 mg/dL (ref >39)
LDL Chol Calc (NIH): 120 mg/dL — ABNORMAL HIGH (ref 0–99)
Triglycerides: 89 mg/dL (ref 0–149)
VLDL Cholesterol Cal: 16 mg/dL (ref 5–40)

## 2022-06-08 ENCOUNTER — Telehealth: Payer: Self-pay

## 2022-06-08 DIAGNOSIS — E782 Mixed hyperlipidemia: Secondary | ICD-10-CM

## 2022-06-08 MED ORDER — ROSUVASTATIN CALCIUM 10 MG PO TABS: 10.0000 mg | ORAL_TABLET | Freq: Every day | ORAL | 3 refills | Status: DC

## 2022-06-08 NOTE — Telephone Encounter (Signed)
Results reviewed with pt as per Dr. Julien Nordmann note. Crestor called to [employer]. Lab req sent to lab. Pt verbalized understanding and had no additional questions. Routed to PCP.

## 2022-06-08 NOTE — Telephone Encounter (Signed)
-----   Message from Jenean Lindau, MD sent at 06/08/2022 11:43 AM EDT ----- Crestor 10 mg daily and liver lipid check in 6 weeks.  Diet.  Copy primary care Jenean Lindau, MD 06/08/2022 11:32 AM

## 2022-07-19 ENCOUNTER — Ambulatory Visit: Payer: Medicare HMO | Admitting: Cardiology

## 2022-07-28 DIAGNOSIS — E782 Mixed hyperlipidemia: Secondary | ICD-10-CM | POA: Diagnosis not present

## 2022-07-29 LAB — HEPATIC FUNCTION PANEL
ALT: 15 IU/L (ref 0–44)
AST: 20 IU/L (ref 0–40)
Albumin: 4.4 g/dL (ref 3.8–4.9)
Alkaline Phosphatase: 66 IU/L (ref 44–121)
Bilirubin Total: 0.9 mg/dL (ref 0.0–1.2)
Bilirubin, Direct: 0.2 mg/dL (ref 0.00–0.40)
Total Protein: 6.5 g/dL (ref 6.0–8.5)

## 2022-07-29 LAB — LIPID PANEL
Chol/HDL Ratio: 2.7 ratio (ref 0.0–5.0)
Cholesterol, Total: 146 mg/dL (ref 100–199)
HDL: 55 mg/dL (ref >39)
LDL Chol Calc (NIH): 76 mg/dL (ref 0–99)
Triglycerides: 75 mg/dL (ref 0–149)
VLDL Cholesterol Cal: 15 mg/dL (ref 5–40)

## 2022-08-01 ENCOUNTER — Ambulatory Visit (INDEPENDENT_AMBULATORY_CARE_PROVIDER_SITE_OTHER): Payer: Medicare HMO | Admitting: Cardiology

## 2022-08-01 ENCOUNTER — Encounter: Payer: Self-pay | Admitting: Cardiology

## 2022-08-01 VITALS — BP 110/80 | HR 64 | Ht 71.0 in | Wt 162.0 lb

## 2022-08-01 DIAGNOSIS — Z951 Presence of aortocoronary bypass graft: Secondary | ICD-10-CM

## 2022-08-01 DIAGNOSIS — E782 Mixed hyperlipidemia: Secondary | ICD-10-CM | POA: Diagnosis not present

## 2022-08-01 DIAGNOSIS — I2510 Atherosclerotic heart disease of native coronary artery without angina pectoris: Secondary | ICD-10-CM | POA: Diagnosis not present

## 2022-08-01 DIAGNOSIS — I10 Essential (primary) hypertension: Secondary | ICD-10-CM

## 2022-08-01 DIAGNOSIS — J431 Panlobular emphysema: Secondary | ICD-10-CM

## 2022-08-01 NOTE — Patient Instructions (Signed)
Medication Instructions:  Your physician recommends that you continue on your current medications as directed. Please refer to the Current Medication list given to you today.  *If you need a refill on your cardiac medications before your next appointment, please call your pharmacy*   Lab Work: NONE If you have labs (blood work) drawn today and your tests are completely normal, you will receive your results only by: [place] (if you have MyChart) OR A paper copy in the mail If you have any lab test that is abnormal or we need to change your treatment, we will call you to review the results.   Testing/Procedures: NONE   Follow-Up: At [facility], you and your health needs are our priority.  As part of our continuing mission to provide you with exceptional heart care, we have created designated Provider Care Teams.  These Care Teams include your primary Cardiologist (physician) and Advanced Practice Providers (APPs -  Physician Assistants and Nurse Practitioners) who all work together to provide you with the care you need, when you need it.  We recommend signing up for the patient portal called "MyChart".  Sign up information is provided on this After Visit Summary.  MyChart is used to connect with patients for Virtual Visits (Telemedicine).  Patients are able to view lab/test results, encounter notes, upcoming appointments, etc.  Non-urgent messages can be sent to your provider as well.   To learn more about what you can do with MyChart, go to [email or web address].    Your next appointment:   9 month(s)  The format for your next appointment:   In Person  Provider:   Jyl Heinz, MD    Other Instructions   Important Information About Sugar

## 2022-08-01 NOTE — Progress Notes (Signed)
Cardiology Office Note:    Date:  08/01/2022   ID:  Russell Harris, DOB 1965-12-27, MRN [id number]  PCP:  Street, Sharon Mt, MD  Cardiologist:  Jenean Lindau, MD   Referring MD: [address], Sharon Mt, *    ASSESSMENT:    1. Coronary artery disease involving native coronary artery of native heart without angina pectoris   2. Benign essential HTN   3. Panlobular emphysema ([place])   4. S/P CABG x 3   5. Mixed hyperlipidemia    PLAN:    In order of problems listed above:  Coronary artery disease: Secondary prevention stressed with the patient.  Importance of compliance with diet medication stressed and vocalized understanding.  He was advised to continue his excellent exercise protocol. Essential hypertension: Blood pressure stable and diet was emphasized.  Lifestyle modification urged. Mixed dyslipidemia on lipid-lowering therapy and tolerating well.  Lipids reviewed. Ex-smoker: Promises never to go back. Patient will be seen in follow-up appointment in 9 months or earlier if the patient has any concerns    Medication Adjustments/Labs and Tests Ordered: Current medicines are reviewed at length with the patient today.  Concerns regarding medicines are outlined above.  No orders of the defined types were placed in this encounter.  No orders of the defined types were placed in this encounter.    Chief Complaint  Patient presents with   Follow-up     History of Present Illness:    Russell Harris is a 56 y.o. male.  Patient has past medical history of coronary artery disease post CABG surgery, essential hypertension and dyslipidemia.  He is an ex-smoker and quit after his surgery.  He denies any chest pain orthopnea or PND.  He tells me that he walks about 5 miles a day without any problems.  At the time of my evaluation, the patient is alert awake oriented and in no distress.  Past Medical History:  Diagnosis Date   Abnormal stress test 09/12/2019   Benign essential HTN  09/12/2019   Chest pain 09/11/2019   COPD (chronic obstructive pulmonary disease) (HCC)    Coronary artery disease involving native heart without angina pectoris    Hodgkin lymphoma ([place])    1997, s/p chemo   Mixed hyperlipidemia 12/07/2020   Other male erectile dysfunction 12/07/2020   Protein-calorie malnutrition, severe 09/18/2019   RBBB    S/P CABG x 3 09/16/2019   Tobacco abuse    Tobacco use 12/07/2020    Past Surgical History:  Procedure Laterality Date   CORONARY ARTERY BYPASS GRAFT N/A 09/16/2019   Procedure: CORONARY ARTERY BYPASS GRAFTING (CABG) x 3 WITH BILATERAL IMA'S AND RIGHT RADIAL ARTERY OPEN HARVESTING.;  Surgeon: Wonda Olds, MD;  Location[place];  Service: Open Heart Surgery;  Laterality: N/A;   LEFT HEART CATH AND CORONARY ANGIOGRAPHY N/A 09/12/2019   Procedure: LEFT HEART CATH AND CORONARY ANGIOGRAPHY;  Surgeon: Troy Sine, MD;  Location: [place] [facility];  Service: Cardiovascular;  Laterality: N/A;   LYMPH NODE BIOPSY     of neck, diagnosed with hodgkin's lymphoma in 1997, s/p 6 month of chemo   TEE WITHOUT CARDIOVERSION N/A 09/16/2019   Procedure: TRANSESOPHAGEAL ECHOCARDIOGRAM (TEE);  Surgeon: Wonda Olds, MD;  Location: [place];  Service: Open Heart Surgery;  Laterality: N/A;    Current Medications: Current Meds  Medication Sig   aspirin EC 81 MG tablet Take 1 tablet (81 mg total) by mouth daily. Swallow whole.   Multiple Vitamin (  MULTIVITAMIN WITH MINERALS) TABS tablet Take 1 tablet by mouth daily.   nitroGLYCERIN (NITROSTAT) 0.4 MG SL tablet Place 0.4 mg under the tongue every 5 (five) minutes as needed for chest pain.   rosuvastatin (CRESTOR) 10 MG tablet Take 1 tablet (10 mg total) by mouth daily.     Allergies:   Ketorolac, Tramadol, and Lipitor [atorvastatin]   Social History   Socioeconomic History   Marital status: Single    Spouse name: Not on file   Number of children: Not on file   Years of education: Not on file   Highest  education level: Not on file  Occupational History   Not on file  Tobacco Use   Smoking status: Former    Types: Cigarettes    Quit date: 09/10/2019    Years since quitting: 2.8   Smokeless tobacco: Never  Vaping Use   Vaping Use: Never used  Substance and Sexual Activity   Alcohol use: Yes    Comment: rarely   Drug use: Yes    Types: Marijuana   Sexual activity: Not on file  Other Topics Concern   Not on file  Social History Narrative   Not on file   Social Determinants of Health   Financial Resource Strain: Not on file  Food Insecurity: Not on file  Transportation Needs: Not on file  Physical Activity: Not on file  Stress: Not on file  Social Connections: Not on file     Family History: The patient's family history includes Aneurysm in his father; Congestive Heart Failure in his mother; Heart attack in his brother and sister.  ROS:   Please see the history of present illness.    All other systems reviewed and are negative.  EKGs/Labs/Other Studies Reviewed:    The following studies were reviewed today: I discussed the findings with the patient including lab work that he did a few days ago.   Recent Labs: 04/20/2022: BUN 16; Creatinine, Ser 1.20; Potassium 4.2; Sodium 139 04/28/2022: Hemoglobin 14.2; Platelets 180; TSH 2.380 07/28/2022: ALT 15  Recent Lipid Panel    Component Value Date/Time   CHOL 146 07/28/2022 1137   TRIG 75 07/28/2022 1137   HDL 55 07/28/2022 1137   CHOLHDL 2.7 07/28/2022 1137   CHOLHDL 3.7 09/12/2019 0246   VLDL 11 09/12/2019 0246   LDLCALC 76 07/28/2022 1137    Physical Exam:    VS:  BP 110/80 (BP Location: Right Arm, Patient Position: Sitting, Cuff Size: Normal)   Pulse 64   Ht '5\' 11"'$  (1.803 m)   Wt 162 lb (73.5 kg)   SpO2 95%   BMI 22.59 kg/m     Wt Readings from Last 3 Encounters:  08/01/22 162 lb (73.5 kg)  04/28/22 171 lb (77.6 kg)  04/19/22 169 lb 9.6 oz (76.9 kg)     GEN: Patient is in no acute distress HEENT:  Normal NECK: No JVD; No carotid bruits LYMPHATICS: No lymphadenopathy CARDIAC: Hear sounds regular, 2/6 systolic murmur at the apex. RESPIRATORY:  Clear to auscultation without rales, wheezing or rhonchi  ABDOMEN: Soft, non-tender, non-distended MUSCULOSKELETAL:  No edema; No deformity  SKIN: Warm and dry NEUROLOGIC:  Alert and oriented x 3 PSYCHIATRIC:  Normal affect   Signed, Jenean Lindau, MD  08/01/2022 3:44 PM    [facility]

## 2023-10-25 ENCOUNTER — Ambulatory Visit: Payer: Medicare HMO | Attending: Cardiology | Admitting: Cardiology

## 2023-10-25 ENCOUNTER — Encounter: Payer: Self-pay | Admitting: Cardiology

## 2023-10-25 VITALS — BP 110/64 | HR 83 | Resp 16 | Ht 71.0 in | Wt 170.6 lb

## 2023-10-25 DIAGNOSIS — I10 Essential (primary) hypertension: Secondary | ICD-10-CM | POA: Diagnosis not present

## 2023-10-25 DIAGNOSIS — I2510 Atherosclerotic heart disease of native coronary artery without angina pectoris: Secondary | ICD-10-CM

## 2023-10-25 DIAGNOSIS — Z1321 Encounter for screening for nutritional disorder: Secondary | ICD-10-CM | POA: Diagnosis not present

## 2023-10-25 DIAGNOSIS — E782 Mixed hyperlipidemia: Secondary | ICD-10-CM | POA: Diagnosis not present

## 2023-10-25 DIAGNOSIS — R000 Tachycardia, unspecified: Secondary | ICD-10-CM

## 2023-10-25 DIAGNOSIS — Z951 Presence of aortocoronary bypass graft: Secondary | ICD-10-CM | POA: Diagnosis not present

## 2023-10-25 MED ORDER — ROSUVASTATIN CALCIUM 10 MG PO TABS: 10.0000 mg | ORAL_TABLET | Freq: Every day | ORAL | 3 refills | Status: DC

## 2023-10-25 MED ORDER — NITROGLYCERIN 0.4 MG SL SUBL: 0.4000 mg | SUBLINGUAL_TABLET | SUBLINGUAL | 6 refills | Status: AC | PRN

## 2023-10-25 NOTE — Progress Notes (Signed)
Cardiology Office Note:    Date:  10/25/2023   ID:  Russell Harris, DOB Apr 11, 1966, MRN [id number]  PCP:  Street, Stephanie Coup, MD  Cardiologist:  Garwin Brothers, MD   Referring MD: [address], Stephanie Coup, *    ASSESSMENT:    1. Coronary artery disease involving native coronary artery of native heart without angina pectoris    PLAN:    In order of problems listed above:  Coronary artery disease: Secondary prevention stressed to the patient.  Importance of compliance with diet medication stressed any vocalized understanding.  He was advised to walk at least half an hour on a daily basis and he promises to do so. Mixed dyslipidemia: On lipid-lowering medications.  He is fasting and will have complete blood work today. Former smoker: Quit about 4 years ago and promises never to go back to smoking. History of lymphoma: In a young age.  No recurrences.  He tells me he is cancer free. History of MND deficiency: He request blood work and we will check this for him. Patient will be seen in follow-up appointment in 6 months or earlier if the patient has any concerns.    Medication Adjustments/Labs and Tests Ordered: Current medicines are reviewed at length with the patient today.  Concerns regarding medicines are outlined above.  Orders Placed This Encounter  Procedures   EKG 12-Lead   No orders of the defined types were placed in this encounter.    Chief Complaint  Patient presents with   Follow-up     History of Present Illness:    Russell Harris is a 57 y.o. male.  Patient has past medical history of coronary artery disease post CABG surgery, COPD, mixed dyslipidemia and history of smoking.  He quit smoking 40 years ago.  He denies any chest pain orthopnea or PND.  He takes care of activities of daily living.  He leads a sedentary lifestyle.  At the time of my evaluation, the patient is alert awake oriented and in no distress.  Past Medical History:  Diagnosis Date   Abnormal  stress test 09/12/2019   Benign essential HTN 09/12/2019   Chest pain 09/11/2019   COPD (chronic obstructive pulmonary disease) (HCC)    Coronary artery disease involving native heart without angina pectoris    Hodgkin lymphoma (HCC)    1997, s/p chemo   Mixed hyperlipidemia 12/07/2020   Other male erectile dysfunction 12/07/2020   Protein-calorie malnutrition, severe 09/18/2019   RBBB    S/P CABG x 3 09/16/2019   Tobacco abuse    Tobacco use 12/07/2020    Past Surgical History:  Procedure Laterality Date   CORONARY ARTERY BYPASS GRAFT N/A 09/16/2019   Procedure: CORONARY ARTERY BYPASS GRAFTING (CABG) x 3 WITH BILATERAL IMA'S AND RIGHT RADIAL ARTERY OPEN HARVESTING.;  Surgeon: Linden Dolin, MD;  Location: [facility] OR;  Service: Open Heart Surgery;  Laterality: N/A;   LEFT HEART CATH AND CORONARY ANGIOGRAPHY N/A 09/12/2019   Procedure: LEFT HEART CATH AND CORONARY ANGIOGRAPHY;  Surgeon: Lennette Bihari, MD;  Location: [facility] INVASIVE CV LAB;  Service: Cardiovascular;  Laterality: N/A;   LYMPH NODE BIOPSY     of neck, diagnosed with hodgkin's lymphoma in 1997, s/p 6 month of chemo   TEE WITHOUT CARDIOVERSION N/A 09/16/2019   Procedure: TRANSESOPHAGEAL ECHOCARDIOGRAM (TEE);  Surgeon: Linden Dolin, MD;  Location: [facility];  Service: Open Heart Surgery;  Laterality: N/A;    Current Medications: Current Meds  Medication Sig  aspirin EC 81 MG tablet Take 1 tablet (81 mg total) by mouth daily. Swallow whole.   Multiple Vitamin (MULTIVITAMIN WITH MINERALS) TABS tablet Take 1 tablet by mouth daily.   nitroGLYCERIN (NITROSTAT) 0.4 MG SL tablet Place 0.4 mg under the tongue every 5 (five) minutes as needed for chest pain.   rosuvastatin (CRESTOR) 10 MG tablet Take 1 tablet (10 mg total) by mouth daily.     Allergies:   Ketorolac, Tramadol, and Lipitor [atorvastatin]   Social History   Socioeconomic History   Marital status: Single    Spouse name: Not on file   Number of children: Not on file    Years of education: Not on file   Highest education level: Not on file  Occupational History   Not on file  Tobacco Use   Smoking status: Former    Current packs/day: 0.00    Types: Cigarettes    Quit date: 09/10/2019    Years since quitting: 4.1   Smokeless tobacco: Never  Vaping Use   Vaping status: Never Used  Substance and Sexual Activity   Alcohol use: Yes    Comment: rarely   Drug use: Yes    Types: Marijuana   Sexual activity: Not on file  Other Topics Concern   Not on file  Social History Narrative   Not on file   Social Determinants of Health   Financial Resource Strain: Not on file  Food Insecurity: Not on file  Transportation Needs: Not on file  Physical Activity: Not on file  Stress: Not on file  Social Connections: Not on file     Family History: The patient's family history includes Aneurysm in his father; Congestive Heart Failure in his mother; Heart attack in his brother and sister.  ROS:   Please see the history of present illness.    All other systems reviewed and are negative.  EKGs/Labs/Other Studies Reviewed:    The following studies were reviewed today: .Marland KitchenEKG Interpretation Date/Time:  Thursday October 25 2023 09:25:16 EDT Ventricular Rate:  83 PR Interval:  128 QRS Duration:  122 QT Interval:  390 QTC Calculation: 458 R Axis:   -83  Text Interpretation: Sinus rhythm with frequent Premature ventricular complexes and Fusion complexes Left anterior fascicular block Cannot rule out Anterior infarct , age undetermined ST & T wave abnormality, consider lateral ischemia Abnormal ECG When compared with ECG of 20-Sep-2019 08:47, Fusion complexes are now Present Incomplete right bundle branch block is no longer Present Minimal criteria for Anterior infarct are now Present Confirmed by Belva Crome [phone]) on 10/25/2023 9:37:01 AM     Recent Labs: No results found for requested labs within last 365 days.  Recent Lipid Panel    Component Value  Date/Time   CHOL 146 07/28/2022 1137   TRIG 75 07/28/2022 1137   HDL 55 07/28/2022 1137   CHOLHDL 2.7 07/28/2022 1137   CHOLHDL 3.7 09/12/2019 0246   VLDL 11 09/12/2019 0246   LDLCALC 76 07/28/2022 1137    Physical Exam:    VS:  BP 110/64 (BP Location: Right Arm, Patient Position: Sitting, Cuff Size: Normal)   Pulse 83   Resp 16   Ht 5\' 11"  (1.803 m)   Wt 170 lb 9.6 oz (77.4 kg)   SpO2 93%   BMI 23.79 kg/m     Wt Readings from Last 3 Encounters:  10/25/23 170 lb 9.6 oz (77.4 kg)  08/01/22 162 lb (73.5 kg)  04/28/22 171 lb (77.6 kg)  GEN: Patient is in no acute distress HEENT: Normal NECK: No JVD; No carotid bruits LYMPHATICS: No lymphadenopathy CARDIAC: Hear sounds regular, 2/6 systolic murmur at the apex. RESPIRATORY:  Clear to auscultation without rales, wheezing or rhonchi  ABDOMEN: Soft, non-tender, non-distended MUSCULOSKELETAL:  No edema; No deformity  SKIN: Warm and dry NEUROLOGIC:  Alert and oriented x 3 PSYCHIATRIC:  Normal affect   Signed, Garwin Brothers, MD  10/25/2023 9:37 AM    [facility]

## 2023-10-25 NOTE — Patient Instructions (Addendum)
Medication Instructions:  Your physician recommends that you continue on your current medications as directed. Please refer to the Current Medication list given to you today.  *If you need a refill on your cardiac medications before your next appointment, please call your pharmacy*   Lab Work: Your physician recommends that you have a CMP, CBC, TSH, magnesium, vitamin D and Lipids today in the office.  If you have labs (blood work) drawn today and your tests are completely normal, you will receive your results only by: MyChart Message (if you have MyChart) OR A paper copy in the mail If you have any lab test that is abnormal or we need to change your treatment, we will call you to review the results.   Testing/Procedures: None ordered   Follow-Up: At [facility], you and your health needs are our priority.  As part of our continuing mission to provide you with exceptional heart care, we have created designated Provider Care Teams.  These Care Teams include your primary Cardiologist (physician) and Advanced Practice Providers (APPs -  Physician Assistants and Nurse Practitioners) who all work together to provide you with the care you need, when you need it.  We recommend signing up for the patient portal called "MyChart".  Sign up information is provided on this After Visit Summary.  MyChart is used to connect with patients for Virtual Visits (Telemedicine).  Patients are able to view lab/test results, encounter notes, upcoming appointments, etc.  Non-urgent messages can be sent to your provider as well.   To learn more about what you can do with MyChart, go to [email or web address].    Your next appointment:   9 month(s)  The format for your next appointment:   In Person  Provider:   Belva Crome, MD    Other Instructions none  Important Information About Sugar

## 2023-10-26 LAB — CBC
Hematocrit: 46 % (ref 37.5–51.0)
Hemoglobin: 15.2 g/dL (ref 13.0–17.7)
MCH: 31.7 pg (ref 26.6–33.0)
MCHC: 33 g/dL (ref 31.5–35.7)
MCV: 96 fL (ref 79–97)
Platelets: 186 10*3/uL (ref 150–450)
RBC: 4.8 x10E6/uL (ref 4.14–5.80)
RDW: 13.1 % (ref 11.6–15.4)
WBC: 10.6 10*3/uL (ref 3.4–10.8)

## 2023-10-26 LAB — COMPREHENSIVE METABOLIC PANEL
ALT: 19 [IU]/L (ref 0–44)
AST: 19 [IU]/L (ref 0–40)
Albumin: 4.6 g/dL (ref 3.8–4.9)
Alkaline Phosphatase: 71 [IU]/L (ref 44–121)
BUN/Creatinine Ratio: 10 (ref 9–20)
BUN: 11 mg/dL (ref 6–24)
Bilirubin Total: 0.8 mg/dL (ref 0.0–1.2)
CO2: 26 mmol/L (ref 20–29)
Calcium: 10 mg/dL (ref 8.7–10.2)
Chloride: 101 mmol/L (ref 96–106)
Creatinine, Ser: 1.08 mg/dL (ref 0.76–1.27)
Globulin, Total: 2.2 g/dL (ref 1.5–4.5)
Glucose: 89 mg/dL (ref 70–99)
Potassium: 4.1 mmol/L (ref 3.5–5.2)
Sodium: 140 mmol/L (ref 134–144)
Total Protein: 6.8 g/dL (ref 6.0–8.5)
eGFR: 79 mL/min/{1.73_m2} (ref >59)

## 2023-10-26 LAB — LIPID PANEL
Chol/HDL Ratio: 3.9 ratio (ref 0.0–5.0)
Cholesterol, Total: 213 mg/dL — ABNORMAL HIGH (ref 100–199)
HDL: 54 mg/dL (ref >39)
LDL Chol Calc (NIH): 141 mg/dL — ABNORMAL HIGH (ref 0–99)
Triglycerides: 98 mg/dL (ref 0–149)
VLDL Cholesterol Cal: 18 mg/dL (ref 5–40)

## 2023-10-26 LAB — TSH: TSH: 3.52 u[IU]/mL (ref 0.450–4.500)

## 2023-10-26 LAB — MAGNESIUM: Magnesium: 1.9 mg/dL (ref 1.6–2.3)

## 2023-10-26 LAB — VITAMIN D 25 HYDROXY (VIT D DEFICIENCY, FRACTURES): Vit D, 25-Hydroxy: 32.5 ng/mL (ref 30.0–100.0)

## 2023-10-30 ENCOUNTER — Telehealth: Payer: Self-pay

## 2023-10-30 DIAGNOSIS — I2510 Atherosclerotic heart disease of native coronary artery without angina pectoris: Secondary | ICD-10-CM

## 2023-10-30 DIAGNOSIS — E782 Mixed hyperlipidemia: Secondary | ICD-10-CM

## 2023-10-30 DIAGNOSIS — Z951 Presence of aortocoronary bypass graft: Secondary | ICD-10-CM

## 2023-10-30 NOTE — Telephone Encounter (Signed)
Results reviewed with pt as per Dr. Revankar's note.  Pt verbalized understanding and had no additional questions. Routed to PCP.  

## 2023-10-30 NOTE — Telephone Encounter (Signed)
-----   Message from Tatums Revankar sent at 10/26/2023  1:09 PM EDT ----- Diet and double statin and LL in 6wks. Give low chol diet sheet !! Very high ldl !! Cc pcp Garwin Brothers, MD 10/26/2023 1:08 PM

## 2023-10-30 NOTE — Telephone Encounter (Signed)
Left vm to return call.  Need to increase Crestor to 20 mg daily.  6 week labs  Needs diet sheet for low cholesterol and low fat.

## 2024-03-18 DIAGNOSIS — J069 Acute upper respiratory infection, unspecified: Secondary | ICD-10-CM | POA: Diagnosis not present

## 2024-03-18 DIAGNOSIS — Z6822 Body mass index (BMI) 22.0-22.9, adult: Secondary | ICD-10-CM | POA: Diagnosis not present

## 2024-08-08 DIAGNOSIS — R9431 Abnormal electrocardiogram [ECG] [EKG]: Secondary | ICD-10-CM | POA: Diagnosis not present

## 2024-08-08 DIAGNOSIS — I252 Old myocardial infarction: Secondary | ICD-10-CM | POA: Diagnosis not present

## 2024-08-08 DIAGNOSIS — R079 Chest pain, unspecified: Secondary | ICD-10-CM | POA: Diagnosis not present

## 2024-08-08 DIAGNOSIS — J449 Chronic obstructive pulmonary disease, unspecified: Secondary | ICD-10-CM | POA: Diagnosis not present

## 2024-10-24 ENCOUNTER — Other Ambulatory Visit: Payer: Self-pay | Admitting: Cardiology

## 2024-10-24 DIAGNOSIS — E782 Mixed hyperlipidemia: Secondary | ICD-10-CM

## 2024-12-11 ENCOUNTER — Other Ambulatory Visit: Payer: Self-pay | Admitting: Cardiology

## 2024-12-11 DIAGNOSIS — E782 Mixed hyperlipidemia: Secondary | ICD-10-CM
